# Patient Record
Sex: Female | Born: 1960
Health system: Southern US, Community
[De-identification: ages and names within clinical notes are randomized; demographics above are authoritative.]

---

## 1998-02-01 ENCOUNTER — Other Ambulatory Visit: Admission: RE | Admit: 1998-02-01 | Discharge: 1998-02-01 | Payer: Self-pay | Admitting: Obstetrics and Gynecology

## 1998-02-07 ENCOUNTER — Ambulatory Visit (HOSPITAL_COMMUNITY): Admission: RE | Admit: 1998-02-07 | Discharge: 1998-02-07 | Payer: Self-pay | Admitting: Obstetrics and Gynecology

## 1999-04-17 ENCOUNTER — Other Ambulatory Visit: Admission: RE | Admit: 1999-04-17 | Discharge: 1999-04-17 | Payer: Self-pay | Admitting: Obstetrics & Gynecology

## 2000-04-20 ENCOUNTER — Other Ambulatory Visit: Admission: RE | Admit: 2000-04-20 | Discharge: 2000-04-20 | Payer: Self-pay | Admitting: Obstetrics and Gynecology

## 2001-04-19 ENCOUNTER — Other Ambulatory Visit: Admission: RE | Admit: 2001-04-19 | Discharge: 2001-04-19 | Payer: Self-pay | Admitting: Obstetrics and Gynecology

## 2003-01-19 ENCOUNTER — Ambulatory Visit (HOSPITAL_COMMUNITY): Admission: RE | Admit: 2003-01-19 | Discharge: 2003-01-19 | Payer: Self-pay | Admitting: Obstetrics and Gynecology

## 2003-01-19 ENCOUNTER — Encounter: Payer: Self-pay | Admitting: Obstetrics and Gynecology

## 2003-06-11 ENCOUNTER — Other Ambulatory Visit: Admission: RE | Admit: 2003-06-11 | Discharge: 2003-06-11 | Payer: Self-pay | Admitting: Obstetrics and Gynecology

## 2004-05-30 ENCOUNTER — Ambulatory Visit (HOSPITAL_COMMUNITY): Admission: RE | Admit: 2004-05-30 | Discharge: 2004-05-30 | Payer: Self-pay | Admitting: Obstetrics and Gynecology

## 2004-07-09 ENCOUNTER — Other Ambulatory Visit: Admission: RE | Admit: 2004-07-09 | Discharge: 2004-07-09 | Payer: Self-pay | Admitting: Obstetrics and Gynecology

## 2004-10-27 ENCOUNTER — Ambulatory Visit: Payer: Self-pay | Admitting: Internal Medicine

## 2005-06-05 ENCOUNTER — Ambulatory Visit (HOSPITAL_COMMUNITY): Admission: RE | Admit: 2005-06-05 | Discharge: 2005-06-05 | Payer: Self-pay | Admitting: Obstetrics and Gynecology

## 2005-07-10 ENCOUNTER — Other Ambulatory Visit: Admission: RE | Admit: 2005-07-10 | Discharge: 2005-07-10 | Payer: Self-pay | Admitting: Obstetrics and Gynecology

## 2006-06-07 ENCOUNTER — Ambulatory Visit (HOSPITAL_COMMUNITY): Admission: RE | Admit: 2006-06-07 | Discharge: 2006-06-07 | Payer: Self-pay | Admitting: Obstetrics and Gynecology

## 2006-10-05 ENCOUNTER — Ambulatory Visit: Payer: Self-pay | Admitting: Family Medicine

## 2006-10-11 ENCOUNTER — Ambulatory Visit: Payer: Self-pay | Admitting: Gastroenterology

## 2006-10-21 ENCOUNTER — Ambulatory Visit: Payer: Self-pay | Admitting: Internal Medicine

## 2007-06-13 ENCOUNTER — Ambulatory Visit (HOSPITAL_COMMUNITY): Admission: RE | Admit: 2007-06-13 | Discharge: 2007-06-13 | Payer: Self-pay | Admitting: Obstetrics and Gynecology

## 2007-08-25 ENCOUNTER — Encounter: Payer: Self-pay | Admitting: Internal Medicine

## 2008-06-27 ENCOUNTER — Encounter: Admission: RE | Admit: 2008-06-27 | Discharge: 2008-06-27 | Payer: Self-pay | Admitting: Obstetrics and Gynecology

## 2009-01-15 ENCOUNTER — Encounter: Payer: Self-pay | Admitting: Internal Medicine

## 2009-07-10 ENCOUNTER — Ambulatory Visit: Payer: Self-pay | Admitting: Diagnostic Radiology

## 2009-07-10 ENCOUNTER — Ambulatory Visit (HOSPITAL_BASED_OUTPATIENT_CLINIC_OR_DEPARTMENT_OTHER): Admission: RE | Admit: 2009-07-10 | Discharge: 2009-07-10 | Payer: Self-pay | Admitting: Obstetrics and Gynecology

## 2010-07-15 ENCOUNTER — Ambulatory Visit (HOSPITAL_BASED_OUTPATIENT_CLINIC_OR_DEPARTMENT_OTHER)
Admission: RE | Admit: 2010-07-15 | Discharge: 2010-07-15 | Payer: Self-pay | Source: Home / Self Care | Attending: Obstetrics and Gynecology | Admitting: Obstetrics and Gynecology

## 2011-07-10 ENCOUNTER — Other Ambulatory Visit (HOSPITAL_COMMUNITY): Payer: Self-pay | Admitting: Obstetrics and Gynecology

## 2011-07-10 DIAGNOSIS — Z1231 Encounter for screening mammogram for malignant neoplasm of breast: Secondary | ICD-10-CM

## 2011-07-20 ENCOUNTER — Ambulatory Visit (HOSPITAL_BASED_OUTPATIENT_CLINIC_OR_DEPARTMENT_OTHER)
Admission: RE | Admit: 2011-07-20 | Discharge: 2011-07-20 | Disposition: A | Discharge status: Home / Self Care | Payer: 59 | Source: Ambulatory Visit | Attending: Obstetrics and Gynecology | Admitting: Obstetrics and Gynecology

## 2011-07-20 DIAGNOSIS — Z1231 Encounter for screening mammogram for malignant neoplasm of breast: Secondary | ICD-10-CM

## 2011-08-06 ENCOUNTER — Ambulatory Visit (INDEPENDENT_AMBULATORY_CARE_PROVIDER_SITE_OTHER): Payer: 59 | Admitting: Family

## 2011-08-06 ENCOUNTER — Encounter: Payer: Self-pay | Admitting: Family

## 2011-08-06 DIAGNOSIS — R109 Unspecified abdominal pain: Secondary | ICD-10-CM

## 2011-08-06 DIAGNOSIS — R7309 Other abnormal glucose: Secondary | ICD-10-CM

## 2011-08-06 DIAGNOSIS — R1030 Lower abdominal pain, unspecified: Secondary | ICD-10-CM

## 2011-08-06 DIAGNOSIS — T148XXA Other injury of unspecified body region, initial encounter: Secondary | ICD-10-CM

## 2011-08-06 DIAGNOSIS — R739 Hyperglycemia, unspecified: Secondary | ICD-10-CM

## 2011-08-06 LAB — LIPID PANEL
Cholesterol: 166 mg/dL (ref 0–200)
HDL: 104.2 mg/dL (ref >39.00)
LDL Cholesterol: 54 mg/dL (ref 0–99)
Total CHOL/HDL Ratio: 2
Triglycerides: 37 mg/dL (ref 0.0–149.0)
VLDL: 7.4 mg/dL (ref 0.0–40.0)

## 2011-08-06 LAB — CBC
HCT: 42 % (ref 36.0–46.0)
Hemoglobin: 14.3 g/dL (ref 12.0–15.0)
MCHC: 34 g/dL (ref 30.0–36.0)
MCV: 91.3 fl (ref 78.0–100.0)
Platelets: 244 10*3/uL (ref 150.0–400.0)
RBC: 4.6 Mil/uL (ref 3.87–5.11)
RDW: 13.3 % (ref 11.5–14.6)
WBC: 3.9 10*3/uL — ABNORMAL LOW (ref 4.5–10.5)

## 2011-08-06 LAB — BASIC METABOLIC PANEL
BUN: 8 mg/dL (ref 6–23)
CO2: 30 mEq/L (ref 19–32)
Calcium: 9.7 mg/dL (ref 8.4–10.5)
Chloride: 104 mEq/L (ref 96–112)
Creatinine, Ser: 0.7 mg/dL (ref 0.4–1.2)
GFR: 120.45 mL/min (ref >60.00)
Glucose, Bld: 91 mg/dL (ref 70–99)
Potassium: 4.5 mEq/L (ref 3.5–5.1)
Sodium: 139 mEq/L (ref 135–145)

## 2011-08-06 LAB — TSH: TSH: 0.92 u[IU]/mL (ref 0.35–5.50)

## 2011-08-06 MED ORDER — CYCLOBENZAPRINE HCL 10 MG PO TABS: 10.0000 mg | ORAL_TABLET | Freq: Three times a day (TID) | ORAL | Status: AC | PRN

## 2011-08-06 NOTE — Patient Instructions (Signed)
Muscle Strain A muscle strain, or pulled muscle, occurs when a muscle is over-stretched. A small number of muscle fibers may also be torn. This is especially common in athletes. This happens when a sudden violent force placed on a muscle pushes it past its capacity. Usually, recovery from a pulled muscle takes 1 to 2 weeks. But complete healing will take 5 to 6 weeks. There are millions of muscle fibers. Following injury, your body will usually return to normal quickly. HOME CARE INSTRUCTIONS   While awake, apply ice to the sore muscle for 15 to 20 minutes each hour for the first 2 days. Put ice in a plastic bag and place a towel between the bag of ice and your skin.   Do not use the pulled muscle for several days. Do not use the muscle if you have pain.   You may wrap the injured area with an elastic bandage for comfort. Be careful not to bind it too tightly. This may interfere with blood circulation.   Only take over-the-counter or prescription medicines for pain, discomfort, or fever as directed by your caregiver. Do not use aspirin as this will increase bleeding (bruising) at injury site.   Warming up before exercise helps prevent muscle strains.  SEEK MEDICAL CARE IF:  There is increased pain or swelling in the affected area. MAKE SURE YOU:   Understand these instructions.   Will watch your condition.   Will get help right away if you are not doing well or get worse.  Document Released: 06/29/2005 Document Revised: 03/11/2011 Document Reviewed: 01/26/2007 [employer] Patient Information 2012 [employer]. 

## 2011-08-06 NOTE — Progress Notes (Signed)
  Subjective:    Patient ID: Catherine Conway, female    DOB: [birth year], 51 y.o.   MRN: [id number]  HPI 51 year old [place] American female, nonsmoker,  patient of. Dr. Fabian Sharp presents today with complaints of left hip pain that's been going on for one month. The pain appears to be worse with sitting. She denies any injury but routinely exercises, to once a week lifting more recently than before. Brief the pain about a 6/10, described it as achy and constant. She has not taken any medications for relief.  Patient has a history of hyperglycemia found on employee health laboratory screening. She is here to have it rechecked. She plans to schedule complete physical exam within the next couple weeks. She is fasting today.   Review of Systems  Constitutional: Negative.   Respiratory: Negative.   Cardiovascular: Negative.   Musculoskeletal: Negative.        Left groin pain  Skin: Negative.   Neurological: Negative.   Hematological: Negative.    No past medical history on file.  History   Social History  . Marital Status: Single    Spouse Name: N/A    Number of Children: N/A  . Years of Education: N/A   Occupational History  . Not on file.   Social History Main Topics  . Smoking status: Never Smoker   . Smokeless tobacco: Not on file  . Alcohol Use: Not on file  . Drug Use: Not on file  . Sexually Active: Not on file   Other Topics Concern  . Not on file   Social History Narrative  . No narrative on file    No past surgical history on file.  No family history on file.  No Known Allergies  No current outpatient prescriptions on file prior to visit.    BP 112/82  Pulse 62  Temp(Src) 99.1 F (37.3 C) (Oral)  Resp 14  Ht 5' 1.5" (1.562 m)  Wt 135 lb (61.236 kg)  BMI 25.10 kg/m2  LMP 01/04/2013chart    Objective:   Physical Exam  Constitutional: She is oriented to person, place, and time.  HENT:  Right Ear: External ear normal.  Left Ear: External ear normal.    Nose: Nose normal.  Mouth/Throat: Oropharynx is clear and moist.  Neck: Normal range of motion. Neck supple.  Cardiovascular: Normal rate, regular rhythm and normal heart sounds.   Pulmonary/Chest: Effort normal and breath sounds normal.  Musculoskeletal: Normal range of motion. She exhibits tenderness.       Tenderness to palpation of the left groin.   Neurological: She is alert and oriented to person, place, and time.  Skin: Skin is warm and dry.  Psychiatric: She has a normal mood and affect.          Assessment & Plan:  Assessment: Left groin pain, muscle strain, hyperglycemia  Plan: Flexeril 10 mg 3 times a day. Warned of drowsiness. Ice and heat to the affected area. Lab sent today for complete physical exam to include CBC, CMP, TSH, and lipids. If the patient pending results encouraged healthy diet continue exercise. Call the office if symptoms worsen or persist recheck when necessary.

## 2011-08-06 NOTE — Progress Notes (Signed)
Quick Note:  Left voice message ______ 

## 2011-08-27 ENCOUNTER — Encounter: Payer: 59 | Admitting: Family

## 2011-09-03 ENCOUNTER — Ambulatory Visit (INDEPENDENT_AMBULATORY_CARE_PROVIDER_SITE_OTHER): Payer: 59 | Admitting: Family

## 2011-09-03 ENCOUNTER — Encounter: Payer: Self-pay | Admitting: Family

## 2011-09-03 VITALS — BP 120/80 | Ht 61.5 in | Wt 136.0 lb

## 2011-09-03 DIAGNOSIS — Z0000 Encounter for general adult medical examination without abnormal findings: Secondary | ICD-10-CM

## 2011-09-03 NOTE — Patient Instructions (Signed)

## 2011-09-03 NOTE — Progress Notes (Signed)
  Subjective:    Patient ID: Catherine Conway, female    DOB: 1961-03-01, 51 y.o.   MRN: [id number]  HPI  This is a routine physical examination for this healthy  Female. Reviewed all health maintenance protocols including mammography colonoscopy bone density and reviewed appropriate screening labs. Her immunization history was reviewed as well as her current medications and allergies refills of her chronic medications were given and the plan for yearly health maintenance was discussed all orders and referrals were made as appropriate.   Review of Systems  Constitutional: Negative.   HENT: Negative.   Eyes: Negative.   Respiratory: Negative.   Cardiovascular: Negative.   Gastrointestinal: Negative.   Genitourinary: Negative.   Musculoskeletal: Negative.   Skin: Negative.   Neurological: Negative.   Hematological: Negative.   Psychiatric/Behavioral: Negative.       No past medical history on file.  History   Social History  . Marital Status: Single    Spouse Name: N/A    Number of Children: N/A  . Years of Education: N/A   Occupational History  . Not on file.   Social History Main Topics  . Smoking status: Never Smoker   . Smokeless tobacco: Not on file  . Alcohol Use: Not on file  . Drug Use: Not on file  . Sexually Active: Not on file   Other Topics Concern  . Not on file   Social History Narrative  . No narrative on file    No past surgical history on file.  No family history on file.  No Known Allergies  Current Outpatient Prescriptions on File Prior to Visit  Medication Sig Dispense Refill  . NON FORMULARY Take by mouth daily. NutraLite Multivitamins.        BP 120/80  Ht 5' 1.5" (1.562 m)  Wt 136 lb (61.689 kg)  BMI 25.28 kg/m2chart  Objective:   Physical Exam  Constitutional: She is oriented to person, place, and time. She appears well-developed and well-nourished.  HENT:  Head: Normocephalic and atraumatic.  Right Ear: External ear normal.    Left Ear: External ear normal.  Nose: Nose normal.  Mouth/Throat: Oropharynx is clear and moist.  Eyes: Conjunctivae and EOM are normal. Pupils are equal, round, and reactive to light.  Neck: Normal range of motion. Neck supple.  Cardiovascular: Normal rate, regular rhythm, normal heart sounds and intact distal pulses.   Pulmonary/Chest: Effort normal and breath sounds normal.  Abdominal: Soft. Bowel sounds are normal.  Genitourinary:       Deferred to GYN  Musculoskeletal: Normal range of motion.  Neurological: She is alert and oriented to person, place, and time. She has normal reflexes.  Skin: Skin is warm and dry.  Psychiatric: She has a normal mood and affect.   Breast exam also deferred to GYN       Assessment & Plan:  Assessment: Complete physical exam  Plan: Continue healthy diet and exercise and she has been doing. Self breast exams on a monthly basis. Calcium and vitamin D daily. Patient to follow with her gynecologist for her Pap smear next month. Recheck here as needed in one year.

## 2011-10-07 ENCOUNTER — Ambulatory Visit (INDEPENDENT_AMBULATORY_CARE_PROVIDER_SITE_OTHER): Payer: 59 | Admitting: Obstetrics and Gynecology

## 2011-10-07 DIAGNOSIS — Z01419 Encounter for gynecological examination (general) (routine) without abnormal findings: Secondary | ICD-10-CM

## 2011-11-11 ENCOUNTER — Ambulatory Visit (INDEPENDENT_AMBULATORY_CARE_PROVIDER_SITE_OTHER): Payer: 59

## 2011-11-11 DIAGNOSIS — Z23 Encounter for immunization: Secondary | ICD-10-CM

## 2012-01-18 ENCOUNTER — Encounter: Payer: Self-pay | Admitting: Family

## 2012-01-18 ENCOUNTER — Ambulatory Visit (INDEPENDENT_AMBULATORY_CARE_PROVIDER_SITE_OTHER): Payer: 59 | Admitting: Family

## 2012-01-18 ENCOUNTER — Ambulatory Visit (INDEPENDENT_AMBULATORY_CARE_PROVIDER_SITE_OTHER)
Admission: RE | Admit: 2012-01-18 | Discharge: 2012-01-18 | Disposition: A | Discharge status: Home / Self Care | Payer: 59 | Source: Ambulatory Visit | Attending: Family | Admitting: Family

## 2012-01-18 VITALS — BP 118/78 | HR 87 | Temp 98.3°F

## 2012-01-18 DIAGNOSIS — M7070 Other bursitis of hip, unspecified hip: Secondary | ICD-10-CM

## 2012-01-18 DIAGNOSIS — M25559 Pain in unspecified hip: Secondary | ICD-10-CM

## 2012-01-18 DIAGNOSIS — M25551 Pain in right hip: Secondary | ICD-10-CM

## 2012-01-18 DIAGNOSIS — M76899 Other specified enthesopathies of unspecified lower limb, excluding foot: Secondary | ICD-10-CM

## 2012-01-18 NOTE — Patient Instructions (Addendum)
Bursitis  Bursitis is a swelling and soreness (inflammation) of a fluid-filled sac (bursa) that overlies and protects a joint. It can be caused by injury, overuse of the joint, arthritis or infection. The joints most likely to be affected are the elbows, shoulders, hips and knees.  HOME CARE INSTRUCTIONS    Apply ice to the affected area for 15 to 20 minutes each hour while awake for 2 days. Put the ice in a plastic bag and place a towel between the bag of ice and your skin.   Rest the injured joint as much as possible, but continue to put the joint through a full range of motion, 4 times per day. (The shoulder joint especially becomes rapidly "frozen" if not used.) When the pain lessens, begin normal slow movements and usual activities.   Only take over-the-counter or prescription medicines for pain, discomfort or fever as directed by your caregiver.   Your caregiver may recommend draining the bursa and injecting medicine into the bursa. This may help the healing process.   Follow all instructions for follow-up with your caregiver. This includes any orthopedic referrals, physical therapy and rehabilitation. Any delay in obtaining necessary care could result in a delay or failure of the bursitis to heal and chronic pain.  SEEK IMMEDIATE MEDICAL CARE IF:    Your pain increases even during treatment.   You develop an oral temperature above 102 F (38.9 C) and have heat and inflammation over the involved bursa.  MAKE SURE YOU:    Understand these instructions.   Will watch your condition.   Will get help right away if you are not doing well or get worse.  Document Released: 06/26/2000 Document Revised: 06/18/2011 Document Reviewed: 05/31/2009  [employer] Patient Information 2012 [employer].

## 2012-01-18 NOTE — Progress Notes (Signed)
  Subjective:    Patient ID: Catherine Conway, female    DOB: 04-26-1961, 51 y.o.   MRN: [id number]  HPI 51 year old American female, nonsmoker is in today with complaints of right hip pain. Her pain has been ongoing x1 week. His improve significantly from last week. She rated it at that time a 9/10, today at 3/10. She has been applying ice and rest. She works out on a daily basis and has recently increased her cardio 15 minutes. Denies any numbness or tingling   Review of Systems  Constitutional: Negative.   Respiratory: Negative.   Cardiovascular: Negative.   Gastrointestinal: Negative.   Genitourinary: Negative.   Musculoskeletal: Positive for arthralgias.       Right hip pain  Skin: Negative.   Neurological: Negative.   Hematological: Negative.   Psychiatric/Behavioral: Negative.    No past medical history on file.  History   Social History  . Marital Status: Single    Spouse Name: N/A    Number of Children: N/A  . Years of Education: N/A   Occupational History  . Not on file.   Social History Main Topics  . Smoking status: Never Smoker   . Smokeless tobacco: Not on file  . Alcohol Use: Not on file  . Drug Use: Not on file  . Sexually Active: Not on file   Other Topics Concern  . Not on file   Social History Narrative  . No narrative on file    No past surgical history on file.  No family history on file.  No Known Allergies  Current Outpatient Prescriptions on File Prior to Visit  Medication Sig Dispense Refill  . cyclobenzaprine (FLEXERIL) 10 MG tablet       . NON FORMULARY Take by mouth daily. NutraLite Multivitamins.        BP 118/78  Pulse 87  Temp 98.3 F (36.8 C) (Oral)  SpO2 98%  LMP 06/10/2013chart    Objective:   Physical Exam  Constitutional: She is oriented to person, place, and time. She appears well-developed and well-nourished.  Neck: Normal range of motion. Neck supple.  Cardiovascular: Normal rate, regular rhythm and normal heart  sounds.   Pulmonary/Chest: Effort normal and breath sounds normal.  Abdominal: Soft.  Musculoskeletal: Normal range of motion. She exhibits no edema.       Right hip pain to palpation. Full ROM with mild pain only with external rotation.  Neurological: She is alert and oriented to person, place, and time. She has normal reflexes.  Skin: Skin is warm and dry.  Psychiatric: She has a normal mood and affect.          Assessment & Plan:  Assessment: Right hip bursitis, right hip pain  Plan: Aleve over-the-counter twice daily. Continue ice. X-ray of the right hip since this problem has occurred before. We'll follow the patient in the results of her x-ray, schedule, when necessary.

## 2012-06-27 ENCOUNTER — Other Ambulatory Visit: Payer: Self-pay | Admitting: Obstetrics and Gynecology

## 2012-06-27 DIAGNOSIS — Z1231 Encounter for screening mammogram for malignant neoplasm of breast: Secondary | ICD-10-CM

## 2012-07-13 HISTORY — PX: COLONOSCOPY: SHX5424

## 2012-07-20 ENCOUNTER — Inpatient Hospital Stay (HOSPITAL_BASED_OUTPATIENT_CLINIC_OR_DEPARTMENT_OTHER): Admission: RE | Admit: 2012-07-20 | Payer: 59 | Source: Ambulatory Visit

## 2012-07-21 ENCOUNTER — Inpatient Hospital Stay (HOSPITAL_BASED_OUTPATIENT_CLINIC_OR_DEPARTMENT_OTHER): Admission: RE | Admit: 2012-07-21 | Payer: 59 | Source: Ambulatory Visit

## 2012-07-27 ENCOUNTER — Ambulatory Visit (HOSPITAL_BASED_OUTPATIENT_CLINIC_OR_DEPARTMENT_OTHER)
Admission: RE | Admit: 2012-07-27 | Discharge: 2012-07-27 | Disposition: A | Discharge status: Home / Self Care | Payer: 59 | Source: Ambulatory Visit | Attending: Obstetrics and Gynecology | Admitting: Obstetrics and Gynecology

## 2012-07-27 DIAGNOSIS — Z1231 Encounter for screening mammogram for malignant neoplasm of breast: Secondary | ICD-10-CM | POA: Insufficient documentation

## 2012-09-21 ENCOUNTER — Ambulatory Visit: Payer: 59 | Admitting: Obstetrics and Gynecology

## 2013-01-11 ENCOUNTER — Other Ambulatory Visit (INDEPENDENT_AMBULATORY_CARE_PROVIDER_SITE_OTHER): Payer: 59

## 2013-01-11 DIAGNOSIS — Z0000 Encounter for general adult medical examination without abnormal findings: Secondary | ICD-10-CM

## 2013-01-11 LAB — POCT URINALYSIS DIPSTICK
Bilirubin, UA: NEGATIVE
Blood, UA: NEGATIVE
Glucose, UA: NEGATIVE
Ketones, UA: NEGATIVE
Leukocytes, UA: NEGATIVE
Nitrite, UA: NEGATIVE
Protein, UA: NEGATIVE
Spec Grav, UA: 1.01
Urobilinogen, UA: 0.2
pH, UA: 7.5

## 2013-01-11 LAB — CBC WITH DIFFERENTIAL/PLATELET
Basophils Absolute: 0 10*3/uL (ref 0.0–0.1)
Basophils Relative: 0.7 % (ref 0.0–3.0)
Eosinophils Absolute: 0.1 10*3/uL (ref 0.0–0.7)
Eosinophils Relative: 2.4 % (ref 0.0–5.0)
HCT: 36 % (ref 36.0–46.0)
Hemoglobin: 12 g/dL (ref 12.0–15.0)
Lymphocytes Relative: 53.6 % — ABNORMAL HIGH (ref 12.0–46.0)
Lymphs Abs: 2 10*3/uL (ref 0.7–4.0)
MCHC: 33.3 g/dL (ref 30.0–36.0)
MCV: 89.9 fl (ref 78.0–100.0)
Monocytes Absolute: 0.3 10*3/uL (ref 0.1–1.0)
Monocytes Relative: 8.9 % (ref 3.0–12.0)
Neutro Abs: 1.3 10*3/uL — ABNORMAL LOW (ref 1.4–7.7)
Neutrophils Relative %: 34.4 % — ABNORMAL LOW (ref 43.0–77.0)
Platelets: 278 10*3/uL (ref 150.0–400.0)
RBC: 4 Mil/uL (ref 3.87–5.11)
RDW: 13.4 % (ref 11.5–14.6)
WBC: 3.8 10*3/uL — ABNORMAL LOW (ref 4.5–10.5)

## 2013-01-11 LAB — LDL CHOLESTEROL, DIRECT: Direct LDL: 58.3 mg/dL

## 2013-01-11 LAB — BASIC METABOLIC PANEL
BUN: 11 mg/dL (ref 6–23)
CO2: 25 mEq/L (ref 19–32)
Calcium: 9.6 mg/dL (ref 8.4–10.5)
Chloride: 105 mEq/L (ref 96–112)
Creatinine, Ser: 0.7 mg/dL (ref 0.4–1.2)
GFR: 111.99 mL/min (ref >60.00)
Glucose, Bld: 101 mg/dL — ABNORMAL HIGH (ref 70–99)
Potassium: 5 mEq/L (ref 3.5–5.1)
Sodium: 139 mEq/L (ref 135–145)

## 2013-01-11 LAB — LIPID PANEL
Cholesterol: 204 mg/dL — ABNORMAL HIGH (ref 0–200)
HDL: 118.2 mg/dL (ref >39.00)
Total CHOL/HDL Ratio: 2
Triglycerides: 36 mg/dL (ref 0.0–149.0)
VLDL: 7.2 mg/dL (ref 0.0–40.0)

## 2013-01-11 LAB — HEPATIC FUNCTION PANEL
ALT: 16 U/L (ref 0–35)
AST: 20 U/L (ref 0–37)
Albumin: 3.9 g/dL (ref 3.5–5.2)
Alkaline Phosphatase: 43 U/L (ref 39–117)
Bilirubin, Direct: 0 mg/dL (ref 0.0–0.3)
Total Bilirubin: 0.6 mg/dL (ref 0.3–1.2)
Total Protein: 7.2 g/dL (ref 6.0–8.3)

## 2013-01-11 LAB — TSH: TSH: 0.9 u[IU]/mL (ref 0.35–5.50)

## 2013-01-17 ENCOUNTER — Encounter: Payer: Self-pay | Admitting: Family

## 2013-01-17 ENCOUNTER — Other Ambulatory Visit: Payer: Self-pay

## 2013-01-17 ENCOUNTER — Encounter: Payer: Self-pay | Admitting: Internal Medicine

## 2013-01-17 ENCOUNTER — Ambulatory Visit (INDEPENDENT_AMBULATORY_CARE_PROVIDER_SITE_OTHER): Payer: 59 | Admitting: Family

## 2013-01-17 VITALS — BP 114/80 | HR 78 | Ht 61.75 in | Wt 145.0 lb

## 2013-01-17 DIAGNOSIS — Z0000 Encounter for general adult medical examination without abnormal findings: Secondary | ICD-10-CM

## 2013-01-17 NOTE — Progress Notes (Signed)
  Subjective:    Patient ID: Catherine Conway, female    DOB: Jan 07, 1961, 52 y.o.   MRN: [id number]  HPI  This is a routine physical examination for this healthy African American Female. Pt reports recent menstrual irregularities that is being managed by GYN. Reviewed all health maintenance protocols including mammography colonoscopy bone density and reviewed appropriate screening labs. Her immunization history was reviewed as well as her current medications and allergies and the plan for yearly health maintenance was discussed all orders and referrals were made as appropriate.   Review of Systems  Constitutional: Negative.   HENT: Negative.   Eyes: Negative.   Respiratory: Negative.   Cardiovascular: Negative.   Gastrointestinal: Negative.   Endocrine: Negative.   Genitourinary: Positive for menstrual problem.  Musculoskeletal: Negative.  Negative for joint swelling.  Skin: Negative.   Allergic/Immunologic: Negative.   Neurological: Negative.   Hematological: Negative.   Psychiatric/Behavioral: Negative.    No past medical history on file.  History   Social History  . Marital Status: Single    Spouse Name: N/A    Number of Children: N/A  . Years of Education: N/A   Occupational History  . Not on file.   Social History Main Topics  . Smoking status: Never Smoker   . Smokeless tobacco: Not on file  . Alcohol Use: Not on file  . Drug Use: Not on file  . Sexually Active: Not on file   Other Topics Concern  . Not on file   Social History Narrative  . No narrative on file    No past surgical history on file.  No family history on file.  No Known Allergies  Current Outpatient Prescriptions on File Prior to Visit  Medication Sig Dispense Refill  . NON FORMULARY Take by mouth daily. NutraLite Multivitamins.      . cyclobenzaprine (FLEXERIL) 10 MG tablet        No current facility-administered medications on file prior to visit.    BP 114/80  Pulse 78  Ht 5' 1.75"  (1.568 m)  Wt 145 lb (65.772 kg)  BMI 26.75 kg/m2  SpO2 97%chart    Objective:   Physical Exam  Constitutional: She is oriented to person, place, and time. She appears well-developed and well-nourished.  HENT:  Head: Normocephalic and atraumatic.  Eyes: Conjunctivae are normal. Pupils are equal, round, and reactive to light.  Neck: Normal range of motion.  Cardiovascular: Normal rate, regular rhythm and normal heart sounds.   Pulmonary/Chest: Effort normal and breath sounds normal.  Abdominal: Soft.  Musculoskeletal: Normal range of motion.  Neurological: She is alert and oriented to person, place, and time.  Skin: Skin is warm and dry.          Assessment & Plan:  1. Physical Referral made to GI for colonoscopy screening. Pt reports no acute issues at this time. Instructed to follow up with PCP if any acute issues arise or with any questions or concerns.  Note by: S.Keah, FNP Student

## 2013-01-17 NOTE — Progress Notes (Signed)
Error

## 2013-01-31 ENCOUNTER — Ambulatory Visit (INDEPENDENT_AMBULATORY_CARE_PROVIDER_SITE_OTHER): Payer: 59 | Admitting: Family

## 2013-01-31 ENCOUNTER — Encounter: Payer: Self-pay | Admitting: Family

## 2013-01-31 ENCOUNTER — Ambulatory Visit (INDEPENDENT_AMBULATORY_CARE_PROVIDER_SITE_OTHER)
Admission: RE | Admit: 2013-01-31 | Discharge: 2013-01-31 | Disposition: A | Discharge status: Home / Self Care | Payer: 59 | Source: Ambulatory Visit | Attending: Family | Admitting: Family

## 2013-01-31 VITALS — BP 100/60 | HR 66

## 2013-01-31 DIAGNOSIS — M79641 Pain in right hand: Secondary | ICD-10-CM

## 2013-01-31 DIAGNOSIS — M79609 Pain in unspecified limb: Secondary | ICD-10-CM

## 2013-01-31 NOTE — Progress Notes (Signed)
  Subjective:    Patient ID: Catherine Conway, female    DOB: [birth year], 52 y.o.   MRN: [id number]  HPI  Pt is a 52 year old [place] American female who presents to PCP with R hand pain x 2 week. Reports a mechanical fall in which she caught her self on her R hand. Pain is localized at the base of the R middle finger. Rates pain 5/10 and reports decreased ROM to the R middle finger. States pain is worsened with movement. Reports managing pain with OTC medications.   Review of Systems  Constitutional: Negative.   HENT: Negative.   Eyes: Negative.   Respiratory: Negative.   Cardiovascular: Negative.   Gastrointestinal: Negative.   Endocrine: Negative.   Genitourinary: Negative.   Musculoskeletal:       R hand pain  Skin: Negative.   Allergic/Immunologic: Negative.   Neurological: Negative.   Hematological: Negative.   Psychiatric/Behavioral: Negative.    No past medical history on file.  History   Social History  . Marital Status: Single    Spouse Name: N/A    Number of Children: N/A  . Years of Education: N/A   Occupational History  . Not on file.   Social History Main Topics  . Smoking status: Never Smoker   . Smokeless tobacco: Not on file  . Alcohol Use: Not on file  . Drug Use: Not on file  . Sexually Active: Not on file   Other Topics Concern  . Not on file   Social History Narrative  . No narrative on file    No past surgical history on file.  No family history on file.  No Known Allergies  Current Outpatient Prescriptions on File Prior to Visit  Medication Sig Dispense Refill  . NON FORMULARY Take by mouth daily. NutraLite Multivitamins.      . cyclobenzaprine (FLEXERIL) 10 MG tablet        No current facility-administered medications on file prior to visit.    BP 100/60  Pulse 66  SpO2 98%chart     Objective:   Physical Exam  Constitutional: She appears well-developed and well-nourished.  HENT:  Head: Normocephalic and atraumatic.  Eyes:  Conjunctivae are normal. Pupils are equal, round, and reactive to light.  Neck: Normal range of motion.  Cardiovascular: Normal rate and regular rhythm.   Pulmonary/Chest: Effort normal and breath sounds normal.  Abdominal: Soft. Bowel sounds are normal.  Musculoskeletal:       Right hand: She exhibits decreased range of motion and tenderness.  Decreased ROM, mild edema and ecchymosis and pain with palpation to base of R middle finger          Assessment & Plan:  1. R hand pain  Pt sent for x-ray of R hand to evaluate for possible fx. Pt instructed to continue OTC pain meds as necessary. PCP will follow up with x-ray results. Pt instructed to contact PCP with any questions or concerns.   Note by Davonna Belling, FNP Student

## 2013-02-08 ENCOUNTER — Ambulatory Visit: Payer: 59 | Admitting: Family

## 2013-02-08 ENCOUNTER — Encounter: Payer: Self-pay | Admitting: Family

## 2013-02-08 VITALS — BP 122/68 | HR 62 | Wt 145.0 lb

## 2013-03-31 ENCOUNTER — Ambulatory Visit (AMBULATORY_SURGERY_CENTER): Payer: Self-pay | Admitting: *Deleted

## 2013-03-31 VITALS — Ht 62.0 in | Wt 142.0 lb

## 2013-03-31 DIAGNOSIS — Z1211 Encounter for screening for malignant neoplasm of colon: Secondary | ICD-10-CM

## 2013-03-31 MED ORDER — NA SULFATE-K SULFATE-MG SULF 17.5-3.13-1.6 GM/177ML PO SOLN: ORAL | Status: DC

## 2013-03-31 NOTE — Progress Notes (Signed)
No egg or soy problems

## 2013-04-04 ENCOUNTER — Encounter: Payer: Self-pay | Admitting: Internal Medicine

## 2013-04-14 ENCOUNTER — Ambulatory Visit (AMBULATORY_SURGERY_CENTER): Payer: 59 | Admitting: Internal Medicine

## 2013-04-14 ENCOUNTER — Encounter: Payer: Self-pay | Admitting: Internal Medicine

## 2013-04-14 VITALS — BP 112/77 | HR 58 | Temp 98.8°F | Resp 37 | Ht 62.0 in | Wt 142.0 lb

## 2013-04-14 DIAGNOSIS — Z1211 Encounter for screening for malignant neoplasm of colon: Secondary | ICD-10-CM

## 2013-04-14 MED ORDER — SODIUM CHLORIDE 0.9 % IV SOLN: 500.0000 mL | INTRAVENOUS | Status: DC

## 2013-04-14 NOTE — Progress Notes (Signed)
Report to pacu rn, vss, bbs=clear 

## 2013-04-14 NOTE — Patient Instructions (Addendum)
The colonoscopy was normal and the prep was excellent. Next routine colonoscopy in 10 years (2024).  I appreciate the opportunity to care for you. Iva Boop, MD, FACG  YOU HAD AN ENDOSCOPIC PROCEDURE TODAY AT THE [facility]: Refer to the procedure report that was given to you for any specific questions about what was found during the examination.  If the procedure report does not answer your questions, please call your gastroenterologist to clarify.  If you requested that your care partner not be given the details of your procedure findings, then the procedure report has been included in a sealed envelope for you to review at your convenience later.  YOU SHOULD EXPECT: Some feelings of bloating in the abdomen. Passage of more gas than usual.  Walking can help get rid of the air that was put into your GI tract during the procedure and reduce the bloating. If you had a lower endoscopy (such as a colonoscopy or flexible sigmoidoscopy) you may notice spotting of blood in your stool or on the toilet paper. If you underwent a bowel prep for your procedure, then you may not have a normal bowel movement for a few days.  DIET: Your first meal following the procedure should be a light meal and then it is ok to progress to your normal diet.  A half-sandwich or bowl of soup is an example of a good first meal.  Heavy or fried foods are harder to digest and may make you feel nauseous or bloated.  Likewise meals heavy in dairy and vegetables can cause extra gas to form and this can also increase the bloating.  Drink plenty of fluids but you should avoid alcoholic beverages for 24 hours.  ACTIVITY: Your care partner should take you home directly after the procedure.  You should plan to take it easy, moving slowly for the rest of the day.  You can resume normal activity the day after the procedure however you should NOT DRIVE or use heavy machinery for 24 hours (because of the sedation medicines used  during the test).    SYMPTOMS TO REPORT IMMEDIATELY: A gastroenterologist can be reached at any hour.  During normal business hours, 8:30 AM to 5:00 PM Monday through Friday, call [phone].  After hours and on weekends, please call the GI answering service at [phone] who will take a message and have the physician on call contact you.   Following lower endoscopy (colonoscopy or flexible sigmoidoscopy):  Excessive amounts of blood in the stool  Significant tenderness or worsening of abdominal pains  Swelling of the abdomen that is new, acute  Fever of 100F or higher  FOLLOW UP: If any biopsies were taken you will be contacted by phone or by letter within the next 1-3 weeks.  Call your gastroenterologist if you have not heard about the biopsies in 3 weeks.  Our staff will call the home number listed on your records the next business day following your procedure to check on you and address any questions or concerns that you may have at that time regarding the information given to you following your procedure. This is a courtesy call and so if there is no answer at the home number and we have not heard from you through the emergency physician on call, we will assume that you have returned to your regular daily activities without incident.  SIGNATURES/CONFIDENTIALITY: You and/or your care partner have signed paperwork which will be entered into your electronic medical record.  These signatures attest to the fact that that the information above on your After Visit Summary has been reviewed and is understood.  Full responsibility of the confidentiality of this discharge information lies with you and/or your care-partner.

## 2013-04-14 NOTE — Progress Notes (Signed)
Patient did not have preoperative order for IV antibiotic SSI prophylaxis. (G8918)  Patient did not experience any of the following events: a burn prior to discharge; a fall within the facility; wrong site/side/patient/procedure/implant event; or a hospital transfer or hospital admission upon discharge from the facility. (G8907)  

## 2013-04-14 NOTE — Op Note (Signed)
[facility] [address] [place], [address]   COLONOSCOPY PROCEDURE REPORT  PATIENT: Catherine Conway, Catherine Conway  MR#: [id number] BIRTHDATE: January 20, 1961 , 52  yrs. old GENDER: Female ENDOSCOPIST: Iva Boop, MD, [facility] REFERRED AO:[id number] Orvan Falconer, FNP-BC PROCEDURE DATE:  04/14/2013 PROCEDURE:   Colonoscopy, screening First Screening Colonoscopy - Avg.  risk and is 50 yrs.  old or older Yes.  Prior Negative Screening - Now for repeat screening. N/A  History of Adenoma - Now for follow-up colonoscopy & has been > or = to 3 yrs.  N/A  Polyps Removed Today? No.  Recommend repeat exam, <10 yrs? No. ASA CLASS:   Class I INDICATIONS:average risk screening and first colonoscopy. MEDICATIONS: propofol (Diprivan) 250mg  IV, MAC sedation, administered by CRNA, and These medications were titrated to patient response per physician's verbal order  DESCRIPTION OF PROCEDURE:   After the risks benefits and alternatives of the procedure were thoroughly explained, informed consent was obtained.  A digital rectal exam revealed no abnormalities of the rectum.   The LB [id number]-[id number]  endoscope was introduced through the anus and advanced to the cecum, which was identified by both the appendix and ileocecal valve. No adverse events experienced.   The quality of the prep was excellent using Suprep  The instrument was then slowly withdrawn as the colon was fully examined.      COLON FINDINGS: A normal appearing cecum, ileocecal valve, and appendiceal orifice were identified.  The ascending, hepatic flexure, transverse, splenic flexure, descending, sigmoid colon and rectum appeared unremarkable.  No polyps or cancers were seen.   A right colon retroflexion was performed.  Retroflexed views revealed no abnormalities. The time to cecum=2 minutes 40 seconds. Withdrawal time=7 minutes 06 seconds.  The scope was withdrawn and the procedure completed. COMPLICATIONS: There were no  complications.  ENDOSCOPIC IMPRESSION: Normal colonoscopy - excellent prep.  RECOMMENDATIONS: Repeat routine colonoscopy 10 years - 2024 - annual hemoccults not necessary in interval. Elderly mother (72) had colon cancer but given age of mom patient is average risk at this point.   eSigned:  Iva Boop, MD, [facility] 04/14/2013 10:53 AM   cc: Jerilynn Som and The Patient

## 2013-04-17 ENCOUNTER — Telehealth: Payer: Self-pay | Admitting: *Deleted

## 2013-04-17 NOTE — Telephone Encounter (Signed)
  Follow up Call-  Call back number 04/14/2013  Post procedure Call Back phone  # [phone]  Permission to leave phone message Yes     Patient questions:  Do you have a fever, pain , or abdominal swelling? no Pain Score  0 *  Have you tolerated food without any problems? yes  Have you been able to return to your normal activities? yes  Do you have any questions about your discharge instructions: Diet   no Medications  no Follow up visit  no  Do you have questions or concerns about your Care? no  Actions: * If pain score is 4 or above: No action needed, pain <4.

## 2013-07-28 ENCOUNTER — Other Ambulatory Visit: Payer: Self-pay | Admitting: Obstetrics and Gynecology

## 2013-07-28 DIAGNOSIS — Z1231 Encounter for screening mammogram for malignant neoplasm of breast: Secondary | ICD-10-CM

## 2013-08-07 ENCOUNTER — Ambulatory Visit (HOSPITAL_BASED_OUTPATIENT_CLINIC_OR_DEPARTMENT_OTHER)
Admission: RE | Admit: 2013-08-07 | Discharge: 2013-08-07 | Disposition: A | Discharge status: Home / Self Care | Payer: 59 | Source: Ambulatory Visit | Attending: Obstetrics and Gynecology | Admitting: Obstetrics and Gynecology

## 2013-08-07 DIAGNOSIS — Z1231 Encounter for screening mammogram for malignant neoplasm of breast: Secondary | ICD-10-CM

## 2013-09-24 ENCOUNTER — Encounter (HOSPITAL_BASED_OUTPATIENT_CLINIC_OR_DEPARTMENT_OTHER): Payer: Self-pay | Admitting: Emergency Medicine

## 2013-09-24 ENCOUNTER — Emergency Department (HOSPITAL_BASED_OUTPATIENT_CLINIC_OR_DEPARTMENT_OTHER)
Admission: EM | Admit: 2013-09-24 | Discharge: 2013-09-24 | Disposition: A | Discharge status: Home / Self Care | Payer: 59 | Attending: Emergency Medicine | Admitting: Emergency Medicine

## 2013-09-24 DIAGNOSIS — S91119A Laceration without foreign body of unspecified toe without damage to nail, initial encounter: Secondary | ICD-10-CM

## 2013-09-24 DIAGNOSIS — Y9389 Activity, other specified: Secondary | ICD-10-CM | POA: Insufficient documentation

## 2013-09-24 DIAGNOSIS — X58XXXA Exposure to other specified factors, initial encounter: Secondary | ICD-10-CM | POA: Insufficient documentation

## 2013-09-24 DIAGNOSIS — S91109A Unspecified open wound of unspecified toe(s) without damage to nail, initial encounter: Secondary | ICD-10-CM | POA: Insufficient documentation

## 2013-09-24 DIAGNOSIS — Z79899 Other long term (current) drug therapy: Secondary | ICD-10-CM | POA: Insufficient documentation

## 2013-09-24 DIAGNOSIS — Y929 Unspecified place or not applicable: Secondary | ICD-10-CM | POA: Insufficient documentation

## 2013-09-24 NOTE — ED Notes (Signed)
Hit right big toe in closet, states has laceration to top of toe- bandage not removed in triage

## 2013-09-24 NOTE — Discharge Instructions (Signed)

## 2013-09-24 NOTE — ED Provider Notes (Signed)
CSN: [id number]     Arrival date & time 09/24/13  1815 History   First MD Initiated Contact with Patient 09/24/13 1826     This chart was scribed for Dagmar Hait, MD by Arlan Organ, ED Scribe. This patient was seen in room MH07/MH07 and the patient's care was started 6:33 PM.   Chief Complaint  Patient presents with  . Toe Injury   Patient is a 53 y.o. female presenting with toe pain. The history is provided by the patient. No language interpreter was used.  Toe Pain This is a new problem. The current episode started 3 to 5 hours ago. The problem occurs constantly. The problem has not changed since onset.Pertinent negatives include no chest pain, no abdominal pain, no headaches and no shortness of breath. Nothing aggravates the symptoms. Nothing relieves the symptoms. She has tried nothing for the symptoms. The treatment provided no relief.    HPI Comments: Meghana Tullo is a 53 y.o. female who presents to the Emergency Department complaining of right great toe pain that started just prior to arrival. She describes the pain as "thobbing". Pt states she scraped her toe while attempting to open the closet door. She has now noted a laceration to the toe. Tetanus UTD. Pt has no other pertinent medical history. No other concerns this visit.   History reviewed. No pertinent past medical history. History reviewed. No pertinent past surgical history. Family History  Problem Relation Age of Onset  . Colon cancer Mother 83  . Esophageal cancer Neg Hx   . Stomach cancer Neg Hx   . Rectal cancer Neg Hx    History  Substance Use Topics  . Smoking status: Never Smoker   . Smokeless tobacco: Never Used  . Alcohol Use: No   OB History   Grav Para Term Preterm Abortions TAB SAB Ect Mult Living                 Review of Systems  Constitutional: Negative for fever and chills.  HENT: Negative for congestion.   Eyes: Negative for redness.  Respiratory: Negative for cough and shortness  of breath.   Cardiovascular: Negative for chest pain.  Gastrointestinal: Negative for abdominal pain.  Skin: Positive for wound.  Neurological: Negative for headaches.  Psychiatric/Behavioral: Negative for confusion.  All other systems reviewed and are negative.      Allergies  Review of patient's allergies indicates no known allergies.  Home Medications   Current Outpatient Rx  Name  Route  Sig  Dispense  Refill  . Ascorbic Acid (VITAMIN C PO)   Oral   Take by mouth daily.         . Cholecalciferol (VITAMIN D PO)   Oral   Take by mouth daily.         . Glucosamine HCl (GLUCOSAMINE PO)   Oral   Take by mouth daily.         . NON FORMULARY   Oral   Take by mouth daily. NutraLite Multivitamins.         . NON FORMULARY      Fruit and vegetables 2 tablets daily         . NON FORMULARY      Osha essential health 1 tablet daily          Triage Vitals: BP 141/74  Pulse 71  Temp(Src) 99.6 F (37.6 C) (Oral)  Resp 20  Ht 5\' 2"  (1.575 m)  Wt 142 lb (64.411 kg)  BMI 25.97 kg/m2  SpO2 100%   Physical Exam  Nursing note and vitals reviewed. Constitutional: She is oriented to person, place, and time. She appears well-developed and well-nourished. No distress.  HENT:  Head: Normocephalic and atraumatic.  Eyes: EOM are normal.  Neck: Normal range of motion.  Cardiovascular: Normal rate, regular rhythm and normal heart sounds.   Pulmonary/Chest: Effort normal and breath sounds normal. No respiratory distress.  Abdominal: Soft. She exhibits no distension. There is no tenderness.  Musculoskeletal: Normal range of motion.  Neurological: She is alert and oriented to person, place, and time.  Skin: Skin is warm and dry.  2 cm laceration across the top of the right great toe Good blood flow  Psychiatric: She has a normal mood and affect. Judgment normal.    ED Course  NERVE BLOCK Date/Time: 09/25/2013 12:12 AM Performed by: Dagmar HaitWALDEN, WILLIAM  Moises Terpstra Authorized by: Dagmar HaitWALDEN, WILLIAM Mahki Spikes Consent: Verbal consent obtained. Indications: pain relief and debridement Body area: lower extremity Nerve: digital Laterality: right (Great toe) Patient sedated: no Preparation: Patient was prepped and draped in the usual sterile fashion. Patient position: sitting Needle gauge: 25 G Location technique: anatomical landmarks Local anesthetic: lidocaine 1% without epinephrine Anesthetic total: 10 ml Outcome: pain improved Patient tolerance: Patient tolerated the procedure well with no immediate complications.  LACERATION REPAIR Date/Time: 09/25/2013 12:13 AM Performed by: Dagmar HaitWALDEN, WILLIAM Alwyn Cordner Authorized by: Dagmar HaitWALDEN, WILLIAM Anyely Cunning Consent: Verbal consent obtained. Body area: lower extremity Location details: right big toe Laceration length: 2 cm Foreign bodies: no foreign bodies Tendon involvement: none Nerve involvement: none Vascular damage: no Anesthesia: local infiltration and digital block Local anesthetic: lidocaine 1% with epinephrine Anesthetic total: 2 (2 cc of local plus digital block) ml Patient sedated: no Preparation: Patient was prepped and draped in the usual sterile fashion. Irrigation solution: saline Irrigation method: jet lavage Amount of cleaning: standard Debridement: none Degree of undermining: none Subcutaneous closure: 4-0 Vicryl Number of sutures: 7 Technique: simple Approximation: close Approximation difficulty: simple Patient tolerance: Patient tolerated the procedure well with no immediate complications.   (including critical care time)  DIAGNOSTIC STUDIES: Oxygen Saturation is 100% on RA, Normal by my interpretation.    COORDINATION OF CARE: 6:37 PM- Will perform laceration repair. Discussed treatment plan with pt at bedside and pt agreed to plan.      Labs Review Labs Reviewed - No data to display Imaging Review No results found.   EKG Interpretation None      MDM   Final  diagnoses:  Toe laceration    86F here with toe lac - tetanus UTD. Repair as above. Absorbable sutures used.  I personally performed the services described in this documentation, which was scribed in my presence. The recorded information has been reviewed and is accurate.  \   Dagmar HaitWilliam Najee Manninen, MD 09/25/13 [phone]

## 2013-11-02 NOTE — Progress Notes (Signed)
   Subjective:    Patient ID: Catherine Conway, female    DOB: 06/30/1961, 53 y.o.   MRN: [id number]  HPI    Review of Systems     Objective:   Physical Exam   Not seen     Assessment & Plan:

## 2013-12-14 ENCOUNTER — Ambulatory Visit (INDEPENDENT_AMBULATORY_CARE_PROVIDER_SITE_OTHER): Payer: 59 | Admitting: Physician Assistant

## 2013-12-14 ENCOUNTER — Encounter: Payer: Self-pay | Admitting: Physician Assistant

## 2013-12-14 VITALS — BP 100/70 | HR 78 | Temp 98.4°F | Resp 18 | Wt 145.0 lb

## 2013-12-14 DIAGNOSIS — J020 Streptococcal pharyngitis: Secondary | ICD-10-CM

## 2013-12-14 DIAGNOSIS — J029 Acute pharyngitis, unspecified: Secondary | ICD-10-CM

## 2013-12-14 LAB — POCT RAPID STREP A (OFFICE): Rapid Strep A Screen: POSITIVE — AB

## 2013-12-14 MED ORDER — PENICILLIN G BENZATHINE 1200000 UNIT/2ML IM SUSP
1.2000 10*6.[IU] | Freq: Once | INTRAMUSCULAR | Status: AC
  Administered 2013-12-14: 1.2 10*6.[IU] via INTRAMUSCULAR

## 2013-12-14 NOTE — Patient Instructions (Addendum)
Because you had the Bicillin shot, there is no need for additional antibiotics.  You may return to work this time tomorrow.  Symptomatic treatment with salt water gargles, throat lozenges, throat sprays.  Drink plenty of water.  Followup as needed, or for worsening or persistent symptoms despite treatment.   Strep Throat Strep throat is an infection of the throat caused by a bacteria named Streptococcus pyogenes. Your caregiver may call the infection streptococcal "tonsillitis" or "pharyngitis" depending on whether there are signs of inflammation in the tonsils or back of the throat. Strep throat is most common in children aged 5 15 years during the cold months of the year, but it can occur in people of any age during any season. This infection is spread from person to person (contagious) through coughing, sneezing, or other close contact. SYMPTOMS   Fever or chills.  Painful, swollen, red tonsils or throat.  Pain or difficulty when swallowing.  White or yellow spots on the tonsils or throat.  Swollen, tender lymph nodes or "glands" of the neck or under the jaw.  Red rash all over the body (rare). DIAGNOSIS  Many different infections can cause the same symptoms. A test must be done to confirm the diagnosis so the right treatment can be given. A "rapid strep test" can help your caregiver make the diagnosis in a few minutes. If this test is not available, a light swab of the infected area can be used for a throat culture test. If a throat culture test is done, results are usually available in a day or two. TREATMENT  Strep throat is treated with antibiotic medicine. HOME CARE INSTRUCTIONS   Gargle with 1 tsp of salt in 1 cup of warm water, 3 4 times per day or as needed for comfort.  Family members who also have a sore throat or fever should be tested for strep throat and treated with antibiotics if they have the strep infection.  Make sure everyone in your household washes their  hands well.  Do not share food, drinking cups, or personal items that could cause the infection to spread to others.  You may need to eat a soft food diet until your sore throat gets better.  Drink enough water and fluids to keep your urine clear or pale yellow. This will help prevent dehydration.  Get plenty of rest.  Stay home from school, daycare, or work until you have been on antibiotics for 24 hours.  Only take over-the-counter or prescription medicines for pain, discomfort, or fever as directed by your caregiver.  If antibiotics are prescribed, take them as directed. Finish them even if you start to feel better. SEEK MEDICAL CARE IF:   The glands in your neck continue to enlarge.  You develop a rash, cough, or earache.  You cough up green, yellow-brown, or bloody sputum.  You have pain or discomfort not controlled by medicines.  Your problems seem to be getting worse rather than better. SEEK IMMEDIATE MEDICAL CARE IF:   You develop any new symptoms such as vomiting, severe headache, stiff or painful neck, chest pain, shortness of breath, or trouble swallowing.  You develop severe throat pain, drooling, or changes in your voice.  You develop swelling of the neck, or the skin on the neck becomes red and tender.  You have a fever.  You develop signs of dehydration, such as fatigue, dry mouth, and decreased urination.  You become increasingly sleepy, or you cannot wake up completely. Document Released: 06/26/2000 Document  Revised: 06/15/2012 Document Reviewed: 08/28/2010 [facility] Center[facility] Patient Information 2014 [place], [place].

## 2013-12-14 NOTE — Progress Notes (Signed)
Pre visit review using our clinic review tool, if applicable. No additional management support is needed unless otherwise documented below in the visit note. 

## 2013-12-14 NOTE — Progress Notes (Signed)
Subjective:    Patient ID: Catherine Conway, female    DOB: 1961-04-18, 53 y.o.   MRN: [id number]  Sore Throat  This is a new problem. The current episode started in the past 7 days. The problem has been gradually improving. Neither side of throat is experiencing more pain than the other. The fever has been present for less than 1 day. The pain is at a severity of 7/10. Associated symptoms include coughing, a hoarse voice, swollen glands and trouble swallowing. Pertinent negatives include no abdominal pain, congestion, diarrhea, drooling, ear discharge, ear pain, headaches, plugged ear sensation, neck pain, shortness of breath, stridor or vomiting. She has had no exposure to strep or mono. Treatments tried: mucinex sever. The treatment provided mild (helped sleep) relief.      Review of Systems  Constitutional: Negative for fever and chills.  HENT: Positive for hoarse voice, postnasal drip, sinus pressure, sore throat and trouble swallowing. Negative for congestion, drooling, ear discharge and ear pain.   Respiratory: Positive for cough. Negative for shortness of breath and stridor.   Gastrointestinal: Negative for nausea, vomiting, abdominal pain and diarrhea.  Musculoskeletal: Negative for neck pain.  Neurological: Negative for headaches.  All other systems reviewed and are negative.    History reviewed. No pertinent past medical history. History reviewed. No pertinent past surgical history.  reports that she has never smoked. She has never used smokeless tobacco. She reports that she does not drink alcohol or use illicit drugs. family history includes Colon cancer (age of onset: 72) in her mother. There is no history of Esophageal cancer, Stomach cancer, or Rectal cancer. No Known Allergies     Objective:   Physical Exam  Nursing note and vitals reviewed. Constitutional: She is oriented to person, place, and time. She appears well-developed and well-nourished. No distress.  HENT:    Head: Normocephalic and atraumatic.  Right Ear: External ear normal.  Left Ear: External ear normal.  Nose: Nose normal.  Mouth/Throat: No oropharyngeal exudate.  Oropharyngeal edema and erythema, no visible exudate. Bilateral TMs normal. Bilateral frontal and maxillary sinuses non-TTP.  Eyes: Conjunctivae and EOM are normal. Pupils are equal, round, and reactive to light.  Neck: Normal range of motion. Neck supple. No JVD present.  Cardiovascular: Normal rate, regular rhythm, normal heart sounds and intact distal pulses.  Exam reveals no gallop and no friction rub.   No murmur heard. Pulmonary/Chest: Effort normal and breath sounds normal. No stridor. No respiratory distress. She has no wheezes. She has no rales. She exhibits no tenderness.  Musculoskeletal: Normal range of motion.  Lymphadenopathy:    She has no cervical adenopathy.  Neurological: She is alert and oriented to person, place, and time.  Skin: Skin is warm and dry. No rash noted. She is not diaphoretic. No erythema. No pallor.  Psychiatric: She has a normal mood and affect. Her behavior is normal. Judgment and thought content normal.    Filed Vitals:   12/14/13 0901  BP: 100/70  Pulse: 78  Temp: 98.4 F (36.9 C)  Resp: 18   Lab Results  Component Value Date   WBC 3.8* 01/11/2013   HGB 12.0 01/11/2013   HCT 36.0 01/11/2013   PLT 278.0 01/11/2013   GLUCOSE 101* 01/11/2013   CHOL 204* 01/11/2013   TRIG 36.0 01/11/2013   HDL 118.20 01/11/2013   LDLDIRECT 58.3 01/11/2013   LDLCALC 54 08/06/2011   ALT 16 01/11/2013   AST 20 01/11/2013   NA 139 01/11/2013  K 5.0 01/11/2013   CL 105 01/11/2013   CREATININE 0.7 01/11/2013   BUN 11 01/11/2013   CO2 25 01/11/2013   TSH 0.90 01/11/2013        Assessment & Plan:  Harriett Sineancy was seen today for sore throat.  Diagnoses and associated orders for this visit:  Streptococcal sore throat Comments: Will treat with Bicillin injection. - penicillin g benzathine (BICILLIN LA) 1200000 UNIT/2ML injection  1.2 Million Units; Inject 2 mLs (1.2 Million Units total) into the muscle once.  Acute pharyngitis - POC Rapid Strep A-   POSITIVE   Plan to follow up as needed, or for worsening or persistent symptoms despite treatment.  Patient Instructions  Because you had the Bicillin shot, there is no need for additional antibiotics.  You may return to work this time tomorrow.  Symptomatic treatment with salt water gargles, throat lozenges, throat sprays.  Drink plenty of water.  Followup as needed, or for worsening or persistent symptoms despite treatment.

## 2014-02-16 ENCOUNTER — Encounter: Payer: 59 | Admitting: Family

## 2014-03-06 ENCOUNTER — Encounter: Payer: Self-pay | Admitting: Family

## 2014-03-06 ENCOUNTER — Ambulatory Visit (INDEPENDENT_AMBULATORY_CARE_PROVIDER_SITE_OTHER): Payer: 59 | Admitting: Family

## 2014-03-06 VITALS — BP 100/80 | HR 61 | Temp 98.2°F | Ht 61.0 in | Wt 145.0 lb

## 2014-03-06 DIAGNOSIS — Z0000 Encounter for general adult medical examination without abnormal findings: Secondary | ICD-10-CM

## 2014-03-06 NOTE — Patient Instructions (Signed)

## 2014-03-06 NOTE — Progress Notes (Signed)
Pre visit review using our clinic review tool, if applicable. No additional management support is needed unless otherwise documented below in the visit note. 

## 2014-03-06 NOTE — Progress Notes (Signed)
Subjective:    Patient ID: Derrill Memo, female    DOB: 01-30-1961, 53 y.o.   MRN: [id number]  HPI 53 year old AAF, nonsmoker, is in today for a CPX. Exercises daily. Sees GYN for female care.  This is a routine wellness  examination for this patient . I reviewed all health maintenance protocols including mammography, bone density Needed referrals were placed. Age and diagnosis  appropriate screening labs were ordered. Her immunization history was reviewed and appropriate vaccinations were ordered. Her current medications and allergies were reviewed and needed refills of her chronic medications were ordered. The plan for yearly health maintenance was discussed all orders and referrals were made as appropriate.   Review of Systems  Constitutional: Negative.   HENT: Negative.   Eyes: Negative.   Respiratory: Negative.   Cardiovascular: Negative.   Gastrointestinal: Negative.   Endocrine: Negative.   Genitourinary: Negative.   Musculoskeletal: Negative.   Skin: Negative.   Allergic/Immunologic: Negative.   Neurological: Negative.   Hematological: Negative.   Psychiatric/Behavioral: Negative.    History reviewed. No pertinent past medical history.  History   Social History  . Marital Status: Single    Spouse Name: N/A    Number of Children: N/A  . Years of Education: N/A   Occupational History  . Not on file.   Social History Main Topics  . Smoking status: Never Smoker   . Smokeless tobacco: Never Used  . Alcohol Use: No  . Drug Use: No  . Sexual Activity: Not on file   Other Topics Concern  . Not on file   Social History Narrative  . No narrative on file    History reviewed. No pertinent past surgical history.  Family History  Problem Relation Age of Onset  . Colon cancer Mother 74  . Esophageal cancer Neg Hx   . Stomach cancer Neg Hx   . Rectal cancer Neg Hx     No Known Allergies  Current Outpatient Prescriptions on File Prior to Visit  Medication Sig  Dispense Refill  . Ascorbic Acid (VITAMIN C PO) Take by mouth daily.      . Cholecalciferol (VITAMIN D PO) Take by mouth daily.      . Glucosamine HCl (GLUCOSAMINE PO) Take by mouth daily.      . NON FORMULARY Take by mouth daily. NutraLite Multivitamins.      . NON FORMULARY Fruit and vegetables 2 tablets daily      . NON FORMULARY Osha essential health 1 tablet daily       No current facility-administered medications on file prior to visit.    BP 100/80  Pulse 61  Temp(Src) 98.2 F (36.8 C) (Oral)  Ht  (1.549 m)  Wt 145 lb (65.772 kg)  BMI 27.41 kg/m2  SpO2 100%chart    Objective:   Physical Exam  Constitutional: She is oriented to person, place, and time. She appears well-developed and well-nourished.  HENT:  Head: Normocephalic and atraumatic.  Right Ear: External ear normal.  Left Ear: External ear normal.  Nose: Nose normal.  Mouth/Throat: Oropharynx is clear and moist.  Eyes: Conjunctivae and EOM are normal. Pupils are equal, round, and reactive to light.  Neck: Normal range of motion. Neck supple. No thyromegaly present.  Cardiovascular: Normal rate, regular rhythm and normal heart sounds.   Pulmonary/Chest: Effort normal and breath sounds normal.  Abdominal: Soft. Bowel sounds are normal. She exhibits no distension. There is no tenderness. There is no rebound.  Musculoskeletal:  Normal range of motion.  Neurological: She is alert and oriented to person, place, and time. She has normal reflexes. No cranial nerve deficit. Coordination normal.  Skin: Skin is warm and dry.  Psychiatric: She has a normal mood and affect.          Assessment & Plan:  Perl was seen today for annual exam.  Diagnoses and associated orders for this visit:  Preventative health care   Call the office with any questions or concerns. Recheck in 1 year and sooner as needed. Monthly self breast exams.

## 2014-07-20 ENCOUNTER — Other Ambulatory Visit (HOSPITAL_BASED_OUTPATIENT_CLINIC_OR_DEPARTMENT_OTHER): Payer: Self-pay | Admitting: Obstetrics and Gynecology

## 2014-07-20 DIAGNOSIS — Z1231 Encounter for screening mammogram for malignant neoplasm of breast: Secondary | ICD-10-CM

## 2014-08-11 ENCOUNTER — Ambulatory Visit (HOSPITAL_BASED_OUTPATIENT_CLINIC_OR_DEPARTMENT_OTHER): Payer: Self-pay

## 2014-08-13 ENCOUNTER — Ambulatory Visit (HOSPITAL_BASED_OUTPATIENT_CLINIC_OR_DEPARTMENT_OTHER)
Admission: RE | Admit: 2014-08-13 | Discharge: 2014-08-13 | Disposition: A | Discharge status: Home / Self Care | Payer: 59 | Source: Ambulatory Visit | Attending: Obstetrics and Gynecology | Admitting: Obstetrics and Gynecology

## 2014-08-13 ENCOUNTER — Ambulatory Visit (HOSPITAL_BASED_OUTPATIENT_CLINIC_OR_DEPARTMENT_OTHER): Payer: Self-pay

## 2014-08-13 DIAGNOSIS — Z1231 Encounter for screening mammogram for malignant neoplasm of breast: Secondary | ICD-10-CM | POA: Diagnosis present

## 2014-12-28 ENCOUNTER — Ambulatory Visit (INDEPENDENT_AMBULATORY_CARE_PROVIDER_SITE_OTHER): Payer: 59 | Admitting: Family Medicine

## 2014-12-28 ENCOUNTER — Encounter: Payer: Self-pay | Admitting: Family Medicine

## 2014-12-28 VITALS — BP 108/72 | HR 61 | Temp 98.3°F | Ht 61.25 in | Wt 146.5 lb

## 2014-12-28 DIAGNOSIS — Z6827 Body mass index (BMI) 27.0-27.9, adult: Secondary | ICD-10-CM

## 2014-12-28 DIAGNOSIS — Z7189 Other specified counseling: Secondary | ICD-10-CM

## 2014-12-28 DIAGNOSIS — Z800 Family history of malignant neoplasm of digestive organs: Secondary | ICD-10-CM | POA: Diagnosis not present

## 2014-12-28 DIAGNOSIS — Z7689 Persons encountering health services in other specified circumstances: Secondary | ICD-10-CM

## 2014-12-28 NOTE — Progress Notes (Signed)
HPI:  Catherine Conway is here to establish care. Used to see [facility]. Last PCP and physical: with [facility] in 02/2014. Sees gyn for gyn/breast health - Dr. Lowell Guitar.  Has the following chronic problems that require follow up and concerns today:  FH Colon Cancer: -mother, age 52 -had colonoscopy 2 years ago normal and told repeat in 10 years -denies: blood in stools, GI complaints, change sin bowels   ROS negative for unless reported above: fevers, unintentional weight loss, hearing or vision loss, chest pain, palpitations, struggling to breath, hemoptysis, melena, hematochezia, hematuria, falls, loc, si, thoughts of self harm  History reviewed. No pertinent past medical history.  History reviewed. No pertinent past surgical history.  Family History  Problem Relation Age of Onset  . Colon cancer Mother 53  . Esophageal cancer Neg Hx   . Stomach cancer Neg Hx   . Rectal cancer Neg Hx     History   Social History  . Marital Status: Single    Spouse Name: N/A  . Number of Children: N/A  . Years of Education: N/A   Social History Main Topics  . Smoking status: Never Smoker   . Smokeless tobacco: Never Used  . Alcohol Use: No  . Drug Use: No  . Sexual Activity: Not on file   Other Topics Concern  . 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  Ascorbic Acid (VITAMIN C PO), Take by mouth daily., Disp: , Rfl:  .  Cholecalciferol (VITAMIN D PO), Take by mouth daily., Disp: , Rfl:  .  Glucosamine HCl (GLUCOSAMINE PO), Take by mouth daily., Disp: , Rfl:  .  NON FORMULARY, Take by mouth daily. NutraLite Multivitamins., Disp: , Rfl:  .  NON FORMULARY, Fruit and vegetables 2 tablets daily, Disp: , Rfl:  .  NON FORMULARY, [facility]sha essential  health 1 tablet daily, Disp: , Rfl:   EXAM:  Filed Vitals:   12/28/14 1438  BP: 108/72  Pulse: 61  Temp: 98.3 F (36.8 C)    Body mass index is 27.45 kg/(m^2).  GENERAL: vitals reviewed and listed above, alert, oriented, appears well hydrated and in no acute distress  HEENT: atraumatic, conjunttiva clear, no obvious abnormalities on inspection of external nose and ears  NECK: no obvious masses on inspection  LUNGS: clear to auscultation bilaterally, no wheezes, rales or rhonchi, good air movement  CV: HRRR, no peripheral edema  SKIN: dark nevus on R wrist  MS: moves all extremities without noticeable abnormality  PSYCH: pleasant and cooperative, no obvious depression or anxiety  ASSESSMENT AND PLAN:  Discussed the following assessment and plan:  Encounter to establish care -We reviewed the PMH, PSH, FH, SH, Meds and Allergies. -We provided refills for any medications we will prescribe as needed. -We addressed current concerns per orders and patient instructions. -We have asked for records for pertinent exams, studies, vaccines and notes from previous providers. -We have advised patient to follow up per instructions below. -reviewed outside labs - cholesterol and Blood glucose normal in April 2016 -follow up in 1 year  FH: colon cancer -follow up per GI  BMI 27.0-27.9,adult -lifestyle recs  Advised derm eval  of the lesion on her arm, she sees a dermatologist and agreed to do this. Reports she has had this forever and is unchanged.    -Patient advised to return or notify a doctor immediately if symptoms worsen or persist or new concerns arise.  Patient Instructions  BEFORE YOU LEAVE: -schedule physical in 1 year  We recommend the following healthy lifestyle measures: - eat a healthy diet consisting of lots of vegetables, fruits, beans, nuts, seeds, healthy meats such as white chicken and fish and whole grains.  - avoid fried foods, fast food, processed  foods, sodas, red meet and other fattening foods.  - get a least 150 minutes of aerobic exercise per week.       Kriste Basque R.

## 2014-12-28 NOTE — Patient Instructions (Signed)
BEFORE YOU LEAVE: -schedule physical in 1 year  We recommend the following healthy lifestyle measures: - eat a healthy diet consisting of lots of vegetables, fruits, beans, nuts, seeds, healthy meats such as white chicken and fish and whole grains.  - avoid fried foods, fast food, processed foods, sodas, red meet and other fattening foods.  - get a least 150 minutes of aerobic exercise per week.

## 2014-12-28 NOTE — Progress Notes (Signed)
Pre visit review using our clinic review tool, if applicable. No additional management support is needed unless otherwise documented below in the visit note. 

## 2015-07-31 ENCOUNTER — Other Ambulatory Visit (HOSPITAL_BASED_OUTPATIENT_CLINIC_OR_DEPARTMENT_OTHER): Payer: Self-pay | Admitting: Obstetrics and Gynecology

## 2015-07-31 DIAGNOSIS — Z1231 Encounter for screening mammogram for malignant neoplasm of breast: Secondary | ICD-10-CM

## 2015-08-15 ENCOUNTER — Ambulatory Visit (HOSPITAL_BASED_OUTPATIENT_CLINIC_OR_DEPARTMENT_OTHER)
Admission: RE | Admit: 2015-08-15 | Discharge: 2015-08-15 | Disposition: A | Discharge status: Home / Self Care | Payer: 59 | Source: Ambulatory Visit | Attending: Obstetrics and Gynecology | Admitting: Obstetrics and Gynecology

## 2015-08-15 DIAGNOSIS — Z1231 Encounter for screening mammogram for malignant neoplasm of breast: Secondary | ICD-10-CM | POA: Insufficient documentation

## 2015-09-02 ENCOUNTER — Encounter: Payer: Self-pay | Admitting: Family Medicine

## 2015-09-02 ENCOUNTER — Ambulatory Visit (INDEPENDENT_AMBULATORY_CARE_PROVIDER_SITE_OTHER): Payer: 59 | Admitting: Family Medicine

## 2015-09-02 VITALS — BP 142/80 | HR 79 | Temp 98.8°F | Ht 61.25 in | Wt 146.2 lb

## 2015-09-02 DIAGNOSIS — J309 Allergic rhinitis, unspecified: Secondary | ICD-10-CM

## 2015-09-02 MED ORDER — FLUTICASONE PROPIONATE 50 MCG/ACT NA SUSP: 2.0000 | Freq: Every day | NASAL | Status: DC

## 2015-09-02 NOTE — Progress Notes (Signed)
HPI:  Nasal congestion: -started: about 1 week ago -symptoms:nasal congestion, sneezing, postnasal drip, itchy and watery eyes -denies:fever, SOB, NVD, tooth pain -has tried: Over-the-counter Tylenol Cold and flu -sick contacts/travel/risks: denies flu exposure   ROS: See pertinent positives and negatives per HPI.  No past medical history on file.  No past surgical history on file.  Family History  Problem Relation Age of Onset  . Colon cancer Mother 27  . Esophageal cancer Neg Hx   . Stomach cancer Neg Hx   . Rectal cancer Neg Hx     Social History   Social History  . Marital Status: Single    Spouse Name: N/A  . Number of Children: N/A  . Years of Education: N/A   Social History Main Topics  . Smoking status: Never Smoker   . Smokeless tobacco: Never Used  . Alcohol Use: No  . Drug Use: No  . Sexual Activity: Not Asked   Other Topics Concern  . 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  Ascorbic Acid (VITAMIN C PO), Take by mouth daily., Disp: , Rfl:  .  Cholecalciferol (VITAMIN D PO), Take by mouth daily., Disp: , Rfl:  .  Glucosamine HCl (GLUCOSAMINE PO), Take by mouth daily., Disp: , Rfl:  .  NON FORMULARY, Take by mouth daily. NutraLite Multivitamins., Disp: , Rfl:  .  NON FORMULARY, Fruit and vegetables 2 tablets daily, Disp: , Rfl:  .  NON FORMULARY, [employer] essential health 1 tablet daily, Disp: , Rfl:  .  fluticasone (FLONASE) 50 MCG/ACT nasal spray, Place 2 sprays into both nostrils daily., Disp: 16 g, Rfl: 6  EXAM:  Filed Vitals:   09/02/15 1610  BP: 142/80  Pulse: 79  Temp: 98.8 F (37.1 C)    Body mass index is 27.39 kg/(m^2).  GENERAL: vitals reviewed and listed above, alert, oriented, appears well  hydrated and in no acute distress  HEENT: atraumatic, conjunttiva clear, no obvious abnormalities on inspection of external nose and ears, normal appearance of ear canals and TMs, clear nasal congestion with pale boggy turbinates, mild post oropharyngeal erythema with PND, no tonsillar edema or exudate, no sinus TTP  NECK: no obvious masses on inspection  LUNGS: clear to auscultation bilaterally, no wheezes, rales or rhonchi, good air movement  CV: HRRR, no peripheral edema  MS: moves all extremities without noticeable abnormality  PSYCH: pleasant and cooperative, no obvious depression or anxiety  ASSESSMENT AND PLAN:  Discussed the following assessment and plan:  Allergic rhinitis, unspecified allergic rhinitis type  -given HPI and exam findings today, a serious infection or illness is unlikely. We discussed potential etiologies, with allergic rhinitis being most likely. We discussed treatment side effects, likely course, antibiotic misuse, transmission, and signs of developing a serious illness. -Start with short course nasal decongestion, intranasal steroid and antihistamine -of course, we advised to return or notify a doctor immediately if symptoms worsen or persist or new concerns arise.    Patient Instructions  Please use Afrin nasal spray twice daily for 3 days, then stop. This medication is available over-the-counter.  Please start Flonase nasal spray 2 sprays each nostril for 21 days,  then you can go to 1 spray each nostril daily.  Please start Allegra or Claritin, this is available over-the-counter -take once daily.  Please follow up if symptoms worsen, new symptoms occur or if symptoms are not responding to treatment.     Kriste Basque R.

## 2015-09-02 NOTE — Patient Instructions (Signed)
Please use Afrin nasal spray twice daily for 3 days, then stop. This medication is available over-the-counter.  Please start Flonase nasal spray 2 sprays each nostril for 21 days, then you can go to 1 spray each nostril daily.  Please start Allegra or Claritin, this is available over-the-counter -take once daily.  Please follow up if symptoms worsen, new symptoms occur or if symptoms are not responding to treatment.

## 2015-09-02 NOTE — Progress Notes (Signed)
Pre visit review using our clinic review tool, if applicable. No additional management support is needed unless otherwise documented below in the visit note. 

## 2015-10-28 LAB — HM PAP SMEAR

## 2015-10-30 ENCOUNTER — Encounter: Payer: Self-pay | Admitting: Family Medicine

## 2016-07-20 ENCOUNTER — Other Ambulatory Visit (HOSPITAL_BASED_OUTPATIENT_CLINIC_OR_DEPARTMENT_OTHER): Payer: Self-pay | Admitting: Obstetrics and Gynecology

## 2016-07-20 DIAGNOSIS — Z1231 Encounter for screening mammogram for malignant neoplasm of breast: Secondary | ICD-10-CM

## 2016-08-17 ENCOUNTER — Ambulatory Visit (HOSPITAL_BASED_OUTPATIENT_CLINIC_OR_DEPARTMENT_OTHER)
Admission: RE | Admit: 2016-08-17 | Discharge: 2016-08-17 | Disposition: A | Discharge status: Home / Self Care | Payer: 59 | Source: Ambulatory Visit | Attending: Obstetrics and Gynecology | Admitting: Obstetrics and Gynecology

## 2016-08-17 ENCOUNTER — Encounter (HOSPITAL_BASED_OUTPATIENT_CLINIC_OR_DEPARTMENT_OTHER): Payer: Self-pay

## 2016-08-17 DIAGNOSIS — Z1231 Encounter for screening mammogram for malignant neoplasm of breast: Secondary | ICD-10-CM | POA: Diagnosis not present

## 2016-08-25 DIAGNOSIS — Z719 Counseling, unspecified: Secondary | ICD-10-CM | POA: Diagnosis not present

## 2016-09-01 DIAGNOSIS — Z719 Counseling, unspecified: Secondary | ICD-10-CM | POA: Diagnosis not present

## 2016-09-08 DIAGNOSIS — Z719 Counseling, unspecified: Secondary | ICD-10-CM | POA: Diagnosis not present

## 2016-09-15 DIAGNOSIS — Z719 Counseling, unspecified: Secondary | ICD-10-CM | POA: Diagnosis not present

## 2016-09-22 DIAGNOSIS — Z719 Counseling, unspecified: Secondary | ICD-10-CM | POA: Diagnosis not present

## 2016-10-29 DIAGNOSIS — Z124 Encounter for screening for malignant neoplasm of cervix: Secondary | ICD-10-CM | POA: Diagnosis not present

## 2016-10-29 DIAGNOSIS — Z01419 Encounter for gynecological examination (general) (routine) without abnormal findings: Secondary | ICD-10-CM | POA: Diagnosis not present

## 2017-01-11 NOTE — Progress Notes (Signed)
HPI:  Here for CPE:  -Concerns and/or follow up today: none Sees Dr. Lowell Guitar, gynecology, for women's health exams - last exam 10/2016. Reports had pap and mammo and these were normal. -Diet: variety of foods, balance and well rounded, -Exercise:  regular exercise  -Taking folic acid, vitamin D or calcium: yes taking vit D  -Diabetes and Dyslipidemia Screening: fasting for labs today  -Vaccines: UTD  -sexual activity: yes, female partner, no new partners  -wants STI testing (Hep C if born [birth year]): agrees to hep c screen  -FH breast, colon or ovarian ca: see FH Last mammogram: UTD - reports done and normal spring 2018 Last colon cancer screening: 2014 per prior report told repeat in 10 years, mother with colon ca at age 54  -Alcohol, Tobacco, drug use: see social history  Review of Systems - no fevers, unintentional weight loss, vision loss, hearing loss, chest pain, sob, hemoptysis, melena, hematochezia, hematuria, genital discharge, changing or concerning skin lesions, bleeding, bruising, loc, thoughts of self harm or SI  No past medical history on file.  No past surgical history on file.  Family History  Problem Relation Age of Onset  . Colon cancer Mother 32  . Esophageal cancer Neg Hx   . Stomach cancer Neg Hx   . Rectal cancer Neg Hx     Social History   Social History  . Marital status: Single    Spouse name: N/A  . Number of children: N/A  . Years of education: N/A   Social History Main Topics  . Smoking status: Never Smoker  . Smokeless tobacco: Never Used  . Alcohol use No  . Drug use: No  . Sexual activity: Not Asked   Other Topics Concern  . 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  Ascorbic Acid (VITAMIN C  PO), Take by mouth daily., Disp: , Rfl:  .  Cholecalciferol (VITAMIN D PO), Take by mouth daily., Disp: , Rfl:  .  fluticasone (FLONASE) 50 MCG/ACT nasal spray, Place 2 sprays into both nostrils daily., Disp: 16 g, Rfl: 6 .  Glucosamine HCl (GLUCOSAMINE PO), Take by mouth daily., Disp: , Rfl:  .  NON FORMULARY, Take by mouth daily. NutraLite Multivitamins., Disp: , Rfl:  .  NON FORMULARY, Fruit and vegetables 2 tablets daily, Disp: , Rfl:  .  NON FORMULARY, [employer] essential health 1 tablet daily, Disp: , Rfl:   EXAM:  Vitals:   01/12/17 0808  BP: 98/70  Pulse: (!) 59  Temp: 97.8 F (36.6 C)    GENERAL: vitals reviewed and listed below, alert, oriented, appears well hydrated and in no acute distress  HEENT: head atraumatic, PERRLA, normal appearance of eyes, ears, nose and mouth. moist mucus membranes.  NECK: supple, no masses or lymphadenopathy  LUNGS: clear to auscultation bilaterally, no rales, rhonchi or wheeze  CV: HRRR, no peripheral edema or cyanosis, normal pedal pulses  ABDOMEN: bowel sounds normal, soft, non tender to palpation, no masses, no rebound or guarding  SKIN: no rash or abnormal lesions - declines full skin exam  GU/BREAST: declined - does with gyn  MS: normal gait, moves all extremities normally  NEURO: normal  gait, speech and thought processing grossly intact, muscle tone grossly intact throughout  PSYCH: normal affect, pleasant and cooperative  ASSESSMENT AND PLAN:  Encounter for preventive health examination  Encounter for HCV screening test for high risk patient  BMI 27.0-27.9,adult  -Discussed and advised all [place] preventive services health task force level A and B recommendations for age, sex and risks.  -Advised at least 150 minutes of exercise per week and a healthy diet with avoidance of (less then 1 serving per week) processed foods, white starches, red meat, fast foods and sweets and consisting of: * 5-9 servings of fresh fruits and  vegetables (not corn or potatoes) *nuts and seeds, beans *olives and olive oil *lean meats such as fish and white chicken  *whole grains  -FASTING labs, studies and vaccines per orders this encounter  No orders of the defined types were placed in this encounter.   Patient advised to return to clinic immediately if symptoms worsen or persist or new concerns.  Patient Instructions  BEFORE YOU LEAVE: -follow up: yearly for CPE -labs  We have ordered labs or studies at this visit. It can take up to 1-2 weeks for results and processing. IF results require follow up or explanation, we will call you with instructions. Clinically stable results will be released to your [facility]. If you have not heard from [place] or cannot find your results in [facility] in 2 weeks please contact our office at [phone].  If you are not yet signed up for [facility], please consider signing up.   Health Maintenance Menopause is a normal process in which your reproductive ability comes to an end. This process happens gradually over a span of months to years, usually between the ages of 65 and 74. Menopause is complete when you have missed 12 consecutive menstrual periods. It is important to talk with your health care provider about some of the most common conditions that affect postmenopausal women, such as heart disease, cancer, and bone loss (osteoporosis). Adopting a healthy lifestyle and getting preventive care can help to promote your health and wellness. Those actions can also lower your chances of developing some of these common conditions. What should I know about menopause? During menopause, you may experience a number of symptoms, such as:  Moderate-to-severe hot flashes.  Night sweats.  Decrease in sex drive.  Mood swings.  Headaches.  Tiredness.  Irritability.  Memory problems.  Insomnia.  Choosing to treat or not to treat menopausal changes is an individual decision that you make with your  health care provider. What should I know about hormone replacement therapy and supplements? Hormone therapy products are effective for treating symptoms that are associated with menopause, such as hot flashes and night sweats. Hormone replacement carries certain risks, especially as you become older. If you are thinking about using estrogen or estrogen with progestin treatments, discuss the benefits and risks with your health care provider. What should I know about heart disease and stroke? Heart disease, heart attack, and stroke become more likely as you age. This may be due, in part, to the hormonal changes that your body experiences during menopause. These can affect how your body processes dietary fats, triglycerides, and cholesterol. Heart attack and stroke are both medical emergencies. There are many things that you can do to help prevent heart disease and stroke:  Have your blood pressure checked at least every 1-2 years. High blood pressure causes heart disease and increases the risk of stroke.  If you are 55-79 years  old, ask your health care provider if you should take aspirin to prevent a heart attack or a stroke.  Do not use any tobacco products, including cigarettes, chewing tobacco, or electronic cigarettes. If you need help quitting, ask your health care provider.  It is important to eat a healthy diet and maintain a healthy weight. ? Be sure to include plenty of vegetables, fruits, low-fat dairy products, and lean protein. ? Avoid eating foods that are high in solid fats, added sugars, or salt (sodium).  Get regular exercise. This is one of the most important things that you can do for your health. ? Try to exercise for at least 150 minutes each week. The type of exercise that you do should increase your heart rate and make you sweat. This is known as moderate-intensity exercise. ? Try to do strengthening exercises at least twice each week. Do these in addition to the  moderate-intensity exercise.  Know your numbers.Ask your health care provider to check your cholesterol and your blood glucose. Continue to have your blood tested as directed by your health care provider.  What should I know about cancer screening? There are several types of cancer. Take the following steps to reduce your risk and to catch any cancer development as early as possible. Breast Cancer  Practice breast self-awareness. ? This means understanding how your breasts normally appear and feel. ? It also means doing regular breast self-exams. Let your health care provider know about any changes, no matter how small.  If you are 28 or older, have a clinician do a breast exam (clinical breast exam or CBE) every year. Depending on your age, family history, and medical history, it may be recommended that you also have a yearly breast X-ray (mammogram).  If you have a family history of breast cancer, talk with your health care provider about genetic screening.  If you are at high risk for breast cancer, talk with your health care provider about having an MRI and a mammogram every year.  Breast cancer (BRCA) gene test is recommended for women who have family members with BRCA-related cancers. Results of the assessment will determine the need for genetic counseling and BRCA1 and for BRCA2 testing. BRCA-related cancers include these types: ? Breast. This occurs in males or females. ? Ovarian. ? Tubal. This may also be called fallopian tube cancer. ? Cancer of the abdominal or pelvic lining (peritoneal cancer). ? Prostate. ? Pancreatic.  Cervical, Uterine, and Ovarian Cancer Your health care provider may recommend that you be screened regularly for cancer of the pelvic organs. These include your ovaries, uterus, and vagina. This screening involves a pelvic exam, which includes checking for microscopic changes to the surface of your cervix (Pap test).  For women ages 21-65, health care  providers may recommend a pelvic exam and a Pap test every three years. For women ages 49-65, they may recommend the Pap test and pelvic exam, combined with testing for human papilloma virus (HPV), every five years. Some types of HPV increase your risk of cervical cancer. Testing for HPV may also be done on women of any age who have unclear Pap test results.  Other health care providers may not recommend any screening for nonpregnant women who are considered low risk for pelvic cancer and have no symptoms. Ask your health care provider if a screening pelvic exam is right for you.  If you have had past treatment for cervical cancer or a condition that could lead to cancer, you need  Pap tests and screening for cancer for at least 20 years after your treatment. If Pap tests have been discontinued for you, your risk factors (such as having a new sexual partner) need to be reassessed to determine if you should start having screenings again. Some women have medical problems that increase the chance of getting cervical cancer. In these cases, your health care provider may recommend that you have screening and Pap tests more often.  If you have a family history of uterine cancer or ovarian cancer, talk with your health care provider about genetic screening.  If you have vaginal bleeding after reaching menopause, tell your health care provider.  There are currently no reliable tests available to screen for ovarian cancer.  Lung Cancer Lung cancer screening is recommended for adults 36-85 years old who are at high risk for lung cancer because of a history of smoking. A yearly low-dose CT scan of the lungs is recommended if you:  Currently smoke.  Have a history of at least 30 pack-years of smoking and you currently smoke or have quit within the past 15 years. A pack-year is smoking an average of one pack of cigarettes per day for one year.  Yearly screening should:  Continue until it has been 15 years  since you quit.  Stop if you develop a health problem that would prevent you from having lung cancer treatment.  Colorectal Cancer  This type of cancer can be detected and can often be prevented.  Routine colorectal cancer screening usually begins at age 39 and continues through age 8.  If you have risk factors for colon cancer, your health care provider may recommend that you be screened at an earlier age.  If you have a family history of colorectal cancer, talk with your health care provider about genetic screening.  Your health care provider may also recommend using home test kits to check for hidden blood in your stool.  A small camera at the end of a tube can be used to examine your colon directly (sigmoidoscopy or colonoscopy). This is done to check for the earliest forms of colorectal cancer.  Direct examination of the colon should be repeated every 5-10 years until age 21. However, if early forms of precancerous polyps or small growths are found or if you have a family history or genetic risk for colorectal cancer, you may need to be screened more often.  Skin Cancer  Check your skin from head to toe regularly.  Monitor any moles. Be sure to tell your health care provider: ? About any new moles or changes in moles, especially if there is a change in a mole's shape or color. ? If you have a mole that is larger than the size of a pencil eraser.  If any of your family members has a history of skin cancer, especially at a young age, talk with your health care provider about genetic screening.  Always use sunscreen. Apply sunscreen liberally and repeatedly throughout the day.  Whenever you are outside, protect yourself by wearing long sleeves, pants, a wide-brimmed hat, and sunglasses.  What should I know about osteoporosis? Osteoporosis is a condition in which bone destruction happens more quickly than new bone creation. After menopause, you may be at an increased risk for  osteoporosis. To help prevent osteoporosis or the bone fractures that can happen because of osteoporosis, the following is recommended:  If you are 36-54 years old, get at least 1,000 mg of calcium and at least 600  mg of vitamin D per day.  If you are older than age 33 but younger than age 90, get at least 1,200 mg of calcium and at least 600 mg of vitamin D per day.  If you are older than age 79, get at least 1,200 mg of calcium and at least 800 mg of vitamin D per day.  Smoking and excessive alcohol intake increase the risk of osteoporosis. Eat foods that are rich in calcium and vitamin D, and do weight-bearing exercises several times each week as directed by your health care provider. What should I know about how menopause affects my mental health? Depression may occur at any age, but it is more common as you become older. Common symptoms of depression include:  Low or sad mood.  Changes in sleep patterns.  Changes in appetite or eating patterns.  Feeling an overall lack of motivation or enjoyment of activities that you previously enjoyed.  Frequent crying spells.  Talk with your health care provider if you think that you are experiencing depression. What should I know about immunizations? It is important that you get and maintain your immunizations. These include:  Tetanus, diphtheria, and pertussis (Tdap) booster vaccine.  Influenza every year before the flu season begins.  Pneumonia vaccine.  Shingles vaccine.  Your health care provider may also recommend other immunizations. This information is not intended to replace advice given to you by your health care provider. Make sure you discuss any questions you have with your health care provider. Document Released: 08/21/2005 Document Revised: 01/17/2016 Document Reviewed: 04/02/2015 Elsevier Interactive Patient Education  2018 [employer].          No Follow-up on file.  Colin Benton R., DO

## 2017-01-12 ENCOUNTER — Ambulatory Visit (INDEPENDENT_AMBULATORY_CARE_PROVIDER_SITE_OTHER): Payer: 59 | Admitting: Family Medicine

## 2017-01-12 ENCOUNTER — Encounter: Payer: Self-pay | Admitting: Family Medicine

## 2017-01-12 VITALS — BP 98/70 | HR 59 | Temp 97.8°F | Ht 61.25 in | Wt 146.2 lb

## 2017-01-12 DIAGNOSIS — Z6827 Body mass index (BMI) 27.0-27.9, adult: Secondary | ICD-10-CM | POA: Diagnosis not present

## 2017-01-12 DIAGNOSIS — Z0000 Encounter for general adult medical examination without abnormal findings: Secondary | ICD-10-CM

## 2017-01-12 DIAGNOSIS — Z9189 Other specified personal risk factors, not elsewhere classified: Secondary | ICD-10-CM | POA: Diagnosis not present

## 2017-01-12 DIAGNOSIS — D72819 Decreased white blood cell count, unspecified: Secondary | ICD-10-CM | POA: Insufficient documentation

## 2017-01-12 DIAGNOSIS — Z1159 Encounter for screening for other viral diseases: Secondary | ICD-10-CM

## 2017-01-12 LAB — LIPID PANEL
CHOL/HDL RATIO: 2
Cholesterol: 167 mg/dL (ref 0–200)
HDL: 92.4 mg/dL (ref >39.00)
LDL Cholesterol: 62 mg/dL (ref 0–99)
NonHDL: 74.23
Triglycerides: 60 mg/dL (ref 0.0–149.0)
VLDL: 12 mg/dL (ref 0.0–40.0)

## 2017-01-12 LAB — HEPATITIS C ANTIBODY: HCV Ab: NEGATIVE

## 2017-01-12 LAB — HEMOGLOBIN A1C: Hgb A1c MFr Bld: 6.1 % (ref 4.6–6.5)

## 2017-01-12 NOTE — Patient Instructions (Addendum)
BEFORE YOU LEAVE: -follow up: yearly for CPE -labs  We have ordered labs or studies at this visit. It can take up to 1-2 weeks for results and processing. IF results require follow up or explanation, we will call you with instructions. Clinically stable results will be released to your [facility]. If you have not heard from [place] or cannot find your results in [facility] in 2 weeks please contact our office at [phone].  If you are not yet signed up for [facility], please consider signing up.   Health Maintenance Menopause is a normal process in which your reproductive ability comes to an end. This process happens gradually over a span of months to years, usually between the ages of 41 and 105. Menopause is complete when you have missed 12 consecutive menstrual periods. It is important to talk with your health care provider about some of the most common conditions that affect postmenopausal women, such as heart disease, cancer, and bone loss (osteoporosis). Adopting a healthy lifestyle and getting preventive care can help to promote your health and wellness. Those actions can also lower your chances of developing some of these common conditions. What should I know about menopause? During menopause, you may experience a number of symptoms, such as:  Moderate-to-severe hot flashes.  Night sweats.  Decrease in sex drive.  Mood swings.  Headaches.  Tiredness.  Irritability.  Memory problems.  Insomnia.  Choosing to treat or not to treat menopausal changes is an individual decision that you make with your health care provider. What should I know about hormone replacement therapy and supplements? Hormone therapy products are effective for treating symptoms that are associated with menopause, such as hot flashes and night sweats. Hormone replacement carries certain risks, especially as you become older. If you are thinking about using estrogen or estrogen with progestin treatments, discuss the  benefits and risks with your health care provider. What should I know about heart disease and stroke? Heart disease, heart attack, and stroke become more likely as you age. This may be due, in part, to the hormonal changes that your body experiences during menopause. These can affect how your body processes dietary fats, triglycerides, and cholesterol. Heart attack and stroke are both medical emergencies. There are many things that you can do to help prevent heart disease and stroke:  Have your blood pressure checked at least every 1-2 years. High blood pressure causes heart disease and increases the risk of stroke.  If you are 77-18 years old, ask your health care provider if you should take aspirin to prevent a heart attack or a stroke.  Do not use any tobacco products, including cigarettes, chewing tobacco, or electronic cigarettes. If you need help quitting, ask your health care provider.  It is important to eat a healthy diet and maintain a healthy weight. ? Be sure to include plenty of vegetables, fruits, low-fat dairy products, and lean protein. ? Avoid eating foods that are high in solid fats, added sugars, or salt (sodium).  Get regular exercise. This is one of the most important things that you can do for your health. ? Try to exercise for at least 150 minutes each week. The type of exercise that you do should increase your heart rate and make you sweat. This is known as moderate-intensity exercise. ? Try to do strengthening exercises at least twice each week. Do these in addition to the moderate-intensity exercise.  Know your numbers.Ask your health care provider to check your cholesterol and your blood glucose.  Continue to have your blood tested as directed by your health care provider.  What should I know about cancer screening? There are several types of cancer. Take the following steps to reduce your risk and to catch any cancer development as early as possible. Breast  Cancer  Practice breast self-awareness. ? This means understanding how your breasts normally appear and feel. ? It also means doing regular breast self-exams. Let your health care provider know about any changes, no matter how small.  If you are 47 or older, have a clinician do a breast exam (clinical breast exam or CBE) every year. Depending on your age, family history, and medical history, it may be recommended that you also have a yearly breast X-ray (mammogram).  If you have a family history of breast cancer, talk with your health care provider about genetic screening.  If you are at high risk for breast cancer, talk with your health care provider about having an MRI and a mammogram every year.  Breast cancer (BRCA) gene test is recommended for women who have family members with BRCA-related cancers. Results of the assessment will determine the need for genetic counseling and BRCA1 and for BRCA2 testing. BRCA-related cancers include these types: ? Breast. This occurs in males or females. ? Ovarian. ? Tubal. This may also be called fallopian tube cancer. ? Cancer of the abdominal or pelvic lining (peritoneal cancer). ? Prostate. ? Pancreatic.  Cervical, Uterine, and Ovarian Cancer Your health care provider may recommend that you be screened regularly for cancer of the pelvic organs. These include your ovaries, uterus, and vagina. This screening involves a pelvic exam, which includes checking for microscopic changes to the surface of your cervix (Pap test).  For women ages 21-65, health care providers may recommend a pelvic exam and a Pap test every three years. For women ages 80-65, they may recommend the Pap test and pelvic exam, combined with testing for human papilloma virus (HPV), every five years. Some types of HPV increase your risk of cervical cancer. Testing for HPV may also be done on women of any age who have unclear Pap test results.  Other health care providers may not  recommend any screening for nonpregnant women who are considered low risk for pelvic cancer and have no symptoms. Ask your health care provider if a screening pelvic exam is right for you.  If you have had past treatment for cervical cancer or a condition that could lead to cancer, you need Pap tests and screening for cancer for at least 20 years after your treatment. If Pap tests have been discontinued for you, your risk factors (such as having a new sexual partner) need to be reassessed to determine if you should start having screenings again. Some women have medical problems that increase the chance of getting cervical cancer. In these cases, your health care provider may recommend that you have screening and Pap tests more often.  If you have a family history of uterine cancer or ovarian cancer, talk with your health care provider about genetic screening.  If you have vaginal bleeding after reaching menopause, tell your health care provider.  There are currently no reliable tests available to screen for ovarian cancer.  Lung Cancer Lung cancer screening is recommended for adults 69-43 years old who are at high risk for lung cancer because of a history of smoking. A yearly low-dose CT scan of the lungs is recommended if you:  Currently smoke.  Have a history of at least  30 pack-years of smoking and you currently smoke or have quit within the past 15 years. A pack-year is smoking an average of one pack of cigarettes per day for one year.  Yearly screening should:  Continue until it has been 15 years since you quit.  Stop if you develop a health problem that would prevent you from having lung cancer treatment.  Colorectal Cancer  This type of cancer can be detected and can often be prevented.  Routine colorectal cancer screening usually begins at age 54 and continues through age 22.  If you have risk factors for colon cancer, your health care provider may recommend that you be screened  at an earlier age.  If you have a family history of colorectal cancer, talk with your health care provider about genetic screening.  Your health care provider may also recommend using home test kits to check for hidden blood in your stool.  A small camera at the end of a tube can be used to examine your colon directly (sigmoidoscopy or colonoscopy). This is done to check for the earliest forms of colorectal cancer.  Direct examination of the colon should be repeated every 5-10 years until age 53. However, if early forms of precancerous polyps or small growths are found or if you have a family history or genetic risk for colorectal cancer, you may need to be screened more often.  Skin Cancer  Check your skin from head to toe regularly.  Monitor any moles. Be sure to tell your health care provider: ? About any new moles or changes in moles, especially if there is a change in a mole's shape or color. ? If you have a mole that is larger than the size of a pencil eraser.  If any of your family members has a history of skin cancer, especially at a young age, talk with your health care provider about genetic screening.  Always use sunscreen. Apply sunscreen liberally and repeatedly throughout the day.  Whenever you are outside, protect yourself by wearing long sleeves, pants, a wide-brimmed hat, and sunglasses.  What should I know about osteoporosis? Osteoporosis is a condition in which bone destruction happens more quickly than new bone creation. After menopause, you may be at an increased risk for osteoporosis. To help prevent osteoporosis or the bone fractures that can happen because of osteoporosis, the following is recommended:  If you are 56-60 years old, get at least 1,000 mg of calcium and at least 600 mg of vitamin D per day.  If you are older than age 55 but younger than age 75, get at least 1,200 mg of calcium and at least 600 mg of vitamin D per day.  If you are older than age 45,  get at least 1,200 mg of calcium and at least 800 mg of vitamin D per day.  Smoking and excessive alcohol intake increase the risk of osteoporosis. Eat foods that are rich in calcium and vitamin D, and do weight-bearing exercises several times each week as directed by your health care provider. What should I know about how menopause affects my mental health? Depression may occur at any age, but it is more common as you become older. Common symptoms of depression include:  Low or sad mood.  Changes in sleep patterns.  Changes in appetite or eating patterns.  Feeling an overall lack of motivation or enjoyment of activities that you previously enjoyed.  Frequent crying spells.  Talk with your health care provider if you think  that you are experiencing depression. What should I know about immunizations? It is important that you get and maintain your immunizations. These include:  Tetanus, diphtheria, and pertussis (Tdap) booster vaccine.  Influenza every year before the flu season begins.  Pneumonia vaccine.  Shingles vaccine.  Your health care provider may also recommend other immunizations. This information is not intended to replace advice given to you by your health care provider. Make sure you discuss any questions you have with your health care provider. Document Released: 08/21/2005 Document Revised: 01/17/2016 Document Reviewed: 04/02/2015 Elsevier Interactive Patient Education  2018 [employer].

## 2017-01-14 ENCOUNTER — Encounter: Payer: Self-pay | Admitting: *Deleted

## 2017-07-20 ENCOUNTER — Encounter: Payer: Self-pay | Admitting: Family Medicine

## 2017-07-20 ENCOUNTER — Ambulatory Visit: Payer: 59 | Admitting: Family Medicine

## 2017-07-20 VITALS — BP 102/72 | HR 78 | Temp 98.6°F | Ht 61.25 in | Wt 141.4 lb

## 2017-07-20 DIAGNOSIS — M549 Dorsalgia, unspecified: Secondary | ICD-10-CM

## 2017-07-20 DIAGNOSIS — M79602 Pain in left arm: Secondary | ICD-10-CM

## 2017-07-20 MED ORDER — CYCLOBENZAPRINE HCL 5 MG PO TABS: 5.0000 mg | ORAL_TABLET | Freq: Every evening | ORAL | 0 refills | Status: DC | PRN

## 2017-07-20 NOTE — Patient Instructions (Signed)
BEFORE YOU LEAVE: -upper back exercises -follow up: 3-4 weeks  Heat, topical menthol (tiger balm is a good option) and Aleve per instructions as needed for pain.  Do the exercises about 4 days/week.  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  Muscle relaxer at night if needed.  Follow-up sooner if any worsening of symptoms.  If not improving we may need to do some imaging but also consider some other treatments.

## 2017-07-20 NOTE — Progress Notes (Signed)
HPI:  Catherine Conway is a pleasant 57 year old here for an acute visit for left shoulder pain.  This started about 3-4 weeks ago after she started to be cannot.  She does a lot of push-ups and planks program.  Pain is dull, mild and is in left upper back near the shoulder blade, she also has some soreness in the triceps at times wrist thumb.  Like Aleve helps the pain.  She has a slight sleeper.  Denies fevers, malaise, weakness, numbness or weight loss.  Denies any history of back or neck issues.  ROS: See pertinent positives and negatives per HPI.  History reviewed. No pertinent past medical history.  History reviewed. No pertinent surgical history.  Family History  Problem Relation Age of Onset  . Colon cancer Mother 1971  . Esophageal cancer Neg Hx   . Stomach cancer Neg Hx   . Rectal cancer Neg Hx     Social History   Socioeconomic History  . Marital status: Single    Spouse name: None  . Number of children: None  . Years of education: None  . Highest education level: None  Social Needs  . Financial resource strain: None  . Food insecurity - worry: None  . Food insecurity - inability: None  . Transportation needs - medical: None  . Transportation needs - non-medical: None  Occupational History  . None  Tobacco Use  . Smoking status: Never Smoker  . Smokeless tobacco: Never Used  Substance and Sexual Activity  . Alcohol use: No  . Drug use: No  . Sexual activity: None  Other Topics Concern  . None  Social History Narrative   Work or School: [employer]      Home Situation: lives with her sister      Spiritual Beliefs: Christian      Lifestyle: 5 days per week of exercise - working with trainer; eats healthy        Current Outpatient Medications:  .  Ascorbic Acid (VITAMIN C PO), Take by mouth daily., Disp: , Rfl:  .  Cholecalciferol (VITAMIN D PO), Take by mouth daily., Disp: , Rfl:  .  fluticasone (FLONASE) 50 MCG/ACT nasal spray, Place 2 sprays  into both nostrils daily., Disp: 16 g, Rfl: 6 .  Glucosamine HCl (GLUCOSAMINE PO), Take by mouth daily., Disp: , Rfl:  .  NON FORMULARY, Take by mouth daily. NutraLite Multivitamins., Disp: , Rfl:  .  NON FORMULARY, Fruit and vegetables 2 tablets daily, Disp: , Rfl:  .  NON FORMULARY, [place] essential health 1 tablet daily, Disp: , Rfl:  .  cyclobenzaprine (FLEXERIL) 5 MG tablet, Take 1 tablet (5 mg total) by mouth at bedtime as needed for muscle spasms., Disp: 20 tablet, Rfl: 0  EXAM:  Vitals:   07/20/17 1302  BP: 102/72  Pulse: 78  Temp: 98.6 F (37 C)    Body mass index is 26.5 kg/m.  GENERAL: vitals reviewed and listed above, alert, oriented, appears well hydrated and in no acute distress  NECK: no obvious masses on inspection  MS: moves all extremities without noticeable abnormality, normal appearance of the head neck and back, tenderness to palpation in the muscles along the thoracic region on the left 4 through 7, no bony tenderness to palpation, normal range of motion of the head and neck, no TTP on exam cervical spine region, normal strength, sensitivity to light touch and DTRs in the upper extremities bilaterally, normal cap refill in the digits bilaterally, negative Spurling  testing  PSYCH: pleasant and cooperative, no obvious depression or anxiety  ASSESSMENT AND PLAN:  Discussed the following assessment and plan:  Upper back pain  Pain of left upper extremity  -we discussed possible serious and likely etiologies, workup and treatment, treatment risks and return precautions -query muscle soreness from new activities versus mild radicular symptoms versus combination or other.  No neuro deficits on exam. -after this discussion, [name] opted for home exercise program, symptomatic care, muscle relaxer at night.  Declined formal physical therapy.  Discussed that she may need imaging and other improving. -follow up advised 3-4 weeks -of course, we advised [name]  to return or  notify a doctor immediately if symptoms worsen or persist or new concerns arise.   Patient Instructions  BEFORE YOU LEAVE: -upper back exercises -follow up: 3-4 weeks  Heat, topical menthol (tiger balm is a good option) and Aleve per instructions as needed for pain.  Do the exercises about 4 days/week.  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  Muscle relaxer at night if needed.  Follow-up sooner if any worsening of symptoms.  If not improving we may need to do some imaging but also consider some other treatments.       Kriste Basque R., DO

## 2017-07-21 DIAGNOSIS — H04123 Dry eye syndrome of bilateral lacrimal glands: Secondary | ICD-10-CM | POA: Diagnosis not present

## 2017-08-09 ENCOUNTER — Other Ambulatory Visit (HOSPITAL_BASED_OUTPATIENT_CLINIC_OR_DEPARTMENT_OTHER): Payer: Self-pay | Admitting: Obstetrics and Gynecology

## 2017-08-09 DIAGNOSIS — Z1231 Encounter for screening mammogram for malignant neoplasm of breast: Secondary | ICD-10-CM

## 2017-08-18 ENCOUNTER — Ambulatory Visit (HOSPITAL_BASED_OUTPATIENT_CLINIC_OR_DEPARTMENT_OTHER)
Admission: RE | Admit: 2017-08-18 | Discharge: 2017-08-18 | Disposition: A | Discharge status: Home / Self Care | Payer: 59 | Source: Ambulatory Visit | Attending: Obstetrics and Gynecology | Admitting: Obstetrics and Gynecology

## 2017-08-18 DIAGNOSIS — Z1231 Encounter for screening mammogram for malignant neoplasm of breast: Secondary | ICD-10-CM | POA: Insufficient documentation

## 2017-11-01 DIAGNOSIS — Z01419 Encounter for gynecological examination (general) (routine) without abnormal findings: Secondary | ICD-10-CM | POA: Diagnosis not present

## 2017-11-01 DIAGNOSIS — Z124 Encounter for screening for malignant neoplasm of cervix: Secondary | ICD-10-CM | POA: Diagnosis not present

## 2017-11-01 DIAGNOSIS — N882 Stricture and stenosis of cervix uteri: Secondary | ICD-10-CM | POA: Diagnosis not present

## 2017-11-01 DIAGNOSIS — N951 Menopausal and female climacteric states: Secondary | ICD-10-CM | POA: Diagnosis not present

## 2018-02-03 NOTE — Progress Notes (Signed)
HPI:  Using dictation device. Unfortunately this device frequently misinterprets words/phrases.  Here for CPE:  -Concerns and/or follow up today:   None.  -Diet: variety of foods, balance and well rounded -Exercise:  regular exercise -Taking folic acid, vitamin D or calcium: Taking vitamin D -Diabetes and Dyslipidemia Screening: done 01/2017 -Vaccines: see vaccine section EPIC -pap history: last pap with [facility] [place] [facility] 10/2015 -FDLMP: see nursing notes -sexual activity: yes, female partner, no new partners -wants STI testing (Hep C if born [birth year]): no -FH breast, colon or ovarian ca: see FH Last mammogram: birads 1 08/2017 Last colon cancer screening: colonosocpy 04/2013 Breast Ca Risk Assessment: see family history and pt history DEXA (>/= 65): Not applicable  -Alcohol, Tobacco, drug use: see social history  Review of Systems - no fevers, unintentional weight loss, vision loss, hearing loss, chest pain, sob, hemoptysis, melena, hematochezia, hematuria, genital discharge, changing or concerning skin lesions, bleeding, bruising, loc, thoughts of self harm or SI  History reviewed. No pertinent past medical history.  History reviewed. No pertinent surgical history.  Family History  Problem Relation Age of Onset  . Colon cancer Mother 10  . Esophageal cancer Neg Hx   . Stomach cancer Neg Hx   . Rectal cancer Neg Hx     Social History   Socioeconomic History  . Marital status: Single    Spouse name: Not on file  . Number of children: Not on file  . Years of education: Not on file  . Highest education level: Not on file  Occupational History  . Not on file  Social Needs  . Financial resource strain: Not on file  . Food insecurity:    Worry: Not on file    Inability: Not on file  . Transportation needs:    Medical: Not on file    Non-medical: Not on file  Tobacco Use  . Smoking status: Never Smoker  . Smokeless tobacco: Never Used  Substance and Sexual  Activity  . Alcohol use: No  . Drug use: No  . Sexual activity: Not on file  Lifestyle  . Physical activity:    Days per week: Not on file    Minutes per session: Not on file  . Stress: Not on file  Relationships  . Social connections:    Talks on phone: Not on file    Gets together: Not on file    Attends religious service: Not on file    Active member of club or organization: Not on file    Attends meetings of clubs or organizations: Not on file    Relationship status: Not on file  Other Topics Concern  . Not on file  Social History Narrative   Work or School: Public house manager Situation: lives with her sister      Spiritual Beliefs: Christian      Lifestyle: 5 days per week of exercise - working with trainer; eats healthy        Current Outpatient Medications:  .  Ascorbic Acid (VITAMIN C PO), Take by mouth daily., Disp: , Rfl:  .  Cholecalciferol (VITAMIN D PO), Take by mouth daily., Disp: , Rfl:  .  fluticasone (FLONASE) 50 MCG/ACT nasal spray, Place 2 sprays into both nostrils daily., Disp: 16 g, Rfl: 6 .  Glucosamine HCl (GLUCOSAMINE PO), Take by mouth daily., Disp: , Rfl:  .  NON FORMULARY, Take by mouth daily. NutraLite Multivitamins., Disp: , Rfl:  .  NON FORMULARY,  Fruit and vegetables 2 tablets daily, Disp: , Rfl:  .  NON FORMULARY, [place] essential health 1 tablet daily, Disp: , Rfl:   EXAM:  Vitals:   02/07/18 0938  BP: 100/68  Pulse: 61  Temp: 98 F (36.7 C)    GENERAL: vitals reviewed and listed below, alert, oriented, appears well hydrated and in no acute distress  HEENT: head atraumatic, PERRLA, normal appearance of eyes, ears, nose and mouth. moist mucus membranes.  NECK: supple, no masses or lymphadenopathy  LUNGS: clear to auscultation bilaterally, no rales, rhonchi or wheeze  CV: HRRR, no peripheral edema or cyanosis, normal pedal pulses  ABDOMEN: bowel sounds normal, soft, non tender to palpation, no masses, no rebound or  guarding  GU/BREAST:  declined, does with GYN yearly per her report  SKIN: no rash or abnormal lesions  MS: normal gait, moves all extremities normally  NEURO: normal gait, speech and thought processing grossly intact, muscle tone grossly intact throughout  PSYCH: normal affect, pleasant and cooperative  ASSESSMENT AND PLAN:  Discussed the following assessment and plan:  PREVENTIVE EXAM: -Discussed and advised all [place] preventive services health task force level A and B recommendations for age, sex and risks. -Advised at least 150 minutes of exercise per week and a healthy diet. see summary in handout -labs, studies and vaccines per orders this encounter   Patient advised to return to clinic immediately if symptoms worsen or persist or new concerns.  Patient Instructions  BEFORE YOU LEAVE: -Labs -follow up: Yearly, and as needed  We have ordered labs or studies at this visit. It can take up to 1-2 weeks for results and processing. IF results require follow up or explanation, we will call you with instructions. Clinically stable results will be released to your [facility]. If you have not heard from [place] or cannot find your results in [facility] in 2 weeks please contact our office at [phone].  If you are not yet signed up for [facility], please consider signing up.   We recommend the following healthy lifestyle for LIFE: 1) Small portions. But, make sure to get regular (at least 3 per day), healthy meals and small healthy snacks if needed.  2) Eat a healthy clean diet.   TRY TO EAT: -at least 5-7 servings of low sugar, colorful, and nutrient rich vegetables per day (not corn, potatoes or bananas.) -berries are the best choice if you wish to eat fruit (only eat small amounts if trying to reduce weight)  -lean meets (fish, white meat of chicken or [place]) -vegan proteins for some meals - beans or tofu, whole grains, nuts and seeds -Replace bad fats with good fats - good fats include:  fish, nuts and seeds, canola oil, olive oil -small amounts of low fat or non fat dairy -small amounts of100 % whole grains - check the lables -drink plenty of water  AVOID: -SUGAR, sweets, anything with added sugar, corn syrup or sweeteners - must read labels as even foods advertised as "healthy" often are loaded with sugar -if you must have a sweetener, small amounts of stevia may be best -sweetened beverages and artificially sweetened beverages -simple starches (rice, bread, potatoes, pasta, chips, etc - small amounts of 100% whole grains are ok) -red meat, pork, butter -fried foods, fast food, processed food, excessive dairy, eggs and coconut.  3)Get at least 150 minutes of sweaty aerobic exercise per week.  4)Reduce stress - consider counseling, meditation and relaxation to balance other aspects of your life.  Preventive Care 40-64 Years, Female Preventive care refers to lifestyle choices and visits with your health care provider that can promote health and wellness. What does preventive care include?  A yearly physical exam. This is also called an annual well check.  Dental exams once or twice a year.  Routine eye exams. Ask your health care provider how often you should have your eyes checked.  Personal lifestyle choices, including: ? Daily care of your teeth and gums. ? Regular physical activity. ? Eating a healthy diet. ? Avoiding tobacco and drug use. ? Limiting alcohol use. ? Practicing safe sex. ? Taking vitamin and mineral supplements as recommended by your health care provider. What happens during an annual well check? The services and screenings done by your health care provider during your annual well check will depend on your age, overall health, lifestyle risk factors, and family history of disease. Counseling Your health care provider may ask you questions about your:  Alcohol use.  Tobacco use.  Drug use.  Emotional well-being.  Home and  relationship well-being.  Sexual activity.  Eating habits.  Work and work Statistician.  Method of birth control.  Menstrual cycle.  Pregnancy history.  Screening You may have the following tests or measurements:  Height, weight, and BMI.  Blood pressure.  Lipid and cholesterol levels. These may be checked every 5 years, or more frequently if you are over 15 years old.  Skin check.  Lung cancer screening. You may have this screening every year starting at age 41 if you have a 30-pack-year history of smoking and currently smoke or have quit within the past 15 years.  Fecal occult blood test (FOBT) of the stool. You may have this test every year starting at age 31.  Flexible sigmoidoscopy or colonoscopy. You may have a sigmoidoscopy every 5 years or a colonoscopy every 10 years starting at age 3.  Hepatitis C blood test.  Hepatitis B blood test.  Sexually transmitted disease (STD) testing.  Diabetes screening. This is done by checking your blood sugar (glucose) after you have not eaten for a while (fasting). You may have this done every 1-3 years.  Mammogram. This may be done every 1-2 years. Talk to your health care provider about when you should start having regular mammograms. This may depend on whether you have a family history of breast cancer.  BRCA-related cancer screening. This may be done if you have a family history of breast, ovarian, tubal, or peritoneal cancers.  Pelvic exam and Pap test. This may be done every 3 years starting at age 23. Starting at age 40, this may be done every 5 years if you have a Pap test in combination with an HPV test.  Bone density scan. This is done to screen for osteoporosis. You may have this scan if you are at high risk for osteoporosis.  Discuss your test results, treatment options, and if necessary, the need for more tests with your health care provider. Vaccines Your health care provider may recommend certain vaccines, such  as:  Influenza vaccine. This is recommended every year.  Tetanus, diphtheria, and acellular pertussis (Tdap, Td) vaccine. You may need a Td booster every 10 years.  Varicella vaccine. You may need this if you have not been vaccinated.  Zoster vaccine. You may need this after age 59.  Measles, mumps, and rubella (MMR) vaccine. You may need at least one dose of MMR if you were born in 1957 or later. You may also need a  second dose.  Pneumococcal 13-valent conjugate (PCV13) vaccine. You may need this if you have certain conditions and were not previously vaccinated.  Pneumococcal polysaccharide (PPSV23) vaccine. You may need one or two doses if you smoke cigarettes or if you have certain conditions.  Meningococcal vaccine. You may need this if you have certain conditions.  Hepatitis A vaccine. You may need this if you have certain conditions or if you travel or work in places where you may be exposed to hepatitis A.  Hepatitis B vaccine. You may need this if you have certain conditions or if you travel or work in places where you may be exposed to hepatitis B.  Haemophilus influenzae type b (Hib) vaccine. You may need this if you have certain conditions.  Talk to your health care provider about which screenings and vaccines you need and how often you need them. This information is not intended to replace advice given to you by your health care provider. Make sure you discuss any questions you have with your health care provider. Document Released: 07/26/2015 Document Revised: 03/18/2016 Document Reviewed: 04/30/2015 Elsevier Interactive Patient Education  2018 [employer].          No follow-ups on file.  Lucretia Kern, DO

## 2018-02-07 ENCOUNTER — Encounter: Payer: Self-pay | Admitting: Family Medicine

## 2018-02-07 ENCOUNTER — Ambulatory Visit (INDEPENDENT_AMBULATORY_CARE_PROVIDER_SITE_OTHER): Payer: 59 | Admitting: Family Medicine

## 2018-02-07 VITALS — BP 100/68 | HR 61 | Temp 98.0°F | Ht 61.0 in | Wt 142.8 lb

## 2018-02-07 DIAGNOSIS — Z0000 Encounter for general adult medical examination without abnormal findings: Secondary | ICD-10-CM | POA: Diagnosis not present

## 2018-02-07 LAB — LIPID PANEL
CHOLESTEROL: 199 mg/dL (ref 0–200)
HDL: 100.2 mg/dL (ref >39.00)
LDL Cholesterol: 86 mg/dL (ref 0–99)
NonHDL: 98.38
TRIGLYCERIDES: 64 mg/dL (ref 0.0–149.0)
Total CHOL/HDL Ratio: 2
VLDL: 12.8 mg/dL (ref 0.0–40.0)

## 2018-02-07 LAB — HEMOGLOBIN A1C: HEMOGLOBIN A1C: 6.2 % (ref 4.6–6.5)

## 2018-02-07 NOTE — Patient Instructions (Signed)
BEFORE YOU LEAVE: -Labs -follow up: Yearly, and as needed  We have ordered labs or studies at this visit. It can take up to 1-2 weeks for results and processing. IF results require follow up or explanation, we will call you with instructions. Clinically stable results will be released to your [facility]. If you have not heard from [place] or cannot find your results in [facility] in 2 weeks please contact our office at [phone].  If you are not yet signed up for [facility], please consider signing up.   We recommend the following healthy lifestyle for LIFE: 1) Small portions. But, make sure to get regular (at least 3 per day), healthy meals and small healthy snacks if needed.  2) Eat a healthy clean diet.   TRY TO EAT: -at least 5-7 servings of low sugar, colorful, and nutrient rich vegetables per day (not corn, potatoes or bananas.) -berries are the best choice if you wish to eat fruit (only eat small amounts if trying to reduce weight)  -lean meets (fish, white meat of chicken or [place]) -vegan proteins for some meals - beans or tofu, whole grains, nuts and seeds -Replace bad fats with good fats - good fats include: fish, nuts and seeds, canola oil, olive oil -small amounts of low fat or non fat dairy -small amounts of100 % whole grains - check the lables -drink plenty of water  AVOID: -SUGAR, sweets, anything with added sugar, corn syrup or sweeteners - must read labels as even foods advertised as "healthy" often are loaded with sugar -if you must have a sweetener, small amounts of stevia may be best -sweetened beverages and artificially sweetened beverages -simple starches (rice, bread, potatoes, pasta, chips, etc - small amounts of 100% whole grains are ok) -red meat, pork, butter -fried foods, fast food, processed food, excessive dairy, eggs and coconut.  3)Get at least 150 minutes of sweaty aerobic exercise per week.  4)Reduce stress - consider counseling, meditation and relaxation to  balance other aspects of your life.   Preventive Care 40-64 Years, Female Preventive care refers to lifestyle choices and visits with your health care provider that can promote health and wellness. What does preventive care include?  A yearly physical exam. This is also called an annual well check.  Dental exams once or twice a year.  Routine eye exams. Ask your health care provider how often you should have your eyes checked.  Personal lifestyle choices, including: ? Daily care of your teeth and gums. ? Regular physical activity. ? Eating a healthy diet. ? Avoiding tobacco and drug use. ? Limiting alcohol use. ? Practicing safe sex. ? Taking vitamin and mineral supplements as recommended by your health care provider. What happens during an annual well check? The services and screenings done by your health care provider during your annual well check will depend on your age, overall health, lifestyle risk factors, and family history of disease. Counseling Your health care provider may ask you questions about your:  Alcohol use.  Tobacco use.  Drug use.  Emotional well-being.  Home and relationship well-being.  Sexual activity.  Eating habits.  Work and work Statistician.  Method of birth control.  Menstrual cycle.  Pregnancy history.  Screening You may have the following tests or measurements:  Height, weight, and BMI.  Blood pressure.  Lipid and cholesterol levels. These may be checked every 5 years, or more frequently if you are over 20 years old.  Skin check.  Lung cancer screening. You may have  this screening every year starting at age 4 if you have a 30-pack-year history of smoking and currently smoke or have quit within the past 15 years.  Fecal occult blood test (FOBT) of the stool. You may have this test every year starting at age 46.  Flexible sigmoidoscopy or colonoscopy. You may have a sigmoidoscopy every 5 years or a colonoscopy every 10 years  starting at age 77.  Hepatitis C blood test.  Hepatitis B blood test.  Sexually transmitted disease (STD) testing.  Diabetes screening. This is done by checking your blood sugar (glucose) after you have not eaten for a while (fasting). You may have this done every 1-3 years.  Mammogram. This may be done every 1-2 years. Talk to your health care provider about when you should start having regular mammograms. This may depend on whether you have a family history of breast cancer.  BRCA-related cancer screening. This may be done if you have a family history of breast, ovarian, tubal, or peritoneal cancers.  Pelvic exam and Pap test. This may be done every 3 years starting at age 2. Starting at age 67, this may be done every 5 years if you have a Pap test in combination with an HPV test.  Bone density scan. This is done to screen for osteoporosis. You may have this scan if you are at high risk for osteoporosis.  Discuss your test results, treatment options, and if necessary, the need for more tests with your health care provider. Vaccines Your health care provider may recommend certain vaccines, such as:  Influenza vaccine. This is recommended every year.  Tetanus, diphtheria, and acellular pertussis (Tdap, Td) vaccine. You may need a Td booster every 10 years.  Varicella vaccine. You may need this if you have not been vaccinated.  Zoster vaccine. You may need this after age 32.  Measles, mumps, and rubella (MMR) vaccine. You may need at least one dose of MMR if you were born in 1957 or later. You may also need a second dose.  Pneumococcal 13-valent conjugate (PCV13) vaccine. You may need this if you have certain conditions and were not previously vaccinated.  Pneumococcal polysaccharide (PPSV23) vaccine. You may need one or two doses if you smoke cigarettes or if you have certain conditions.  Meningococcal vaccine. You may need this if you have certain conditions.  Hepatitis A  vaccine. You may need this if you have certain conditions or if you travel or work in places where you may be exposed to hepatitis A.  Hepatitis B vaccine. You may need this if you have certain conditions or if you travel or work in places where you may be exposed to hepatitis B.  Haemophilus influenzae type b (Hib) vaccine. You may need this if you have certain conditions.  Talk to your health care provider about which screenings and vaccines you need and how often you need them. This information is not intended to replace advice given to you by your health care provider. Make sure you discuss any questions you have with your health care provider. Document Released: 07/26/2015 Document Revised: 03/18/2016 Document Reviewed: 04/30/2015 Elsevier Interactive Patient Education  [employer].

## 2018-02-14 ENCOUNTER — Telehealth: Payer: Self-pay | Admitting: Family Medicine

## 2018-02-14 NOTE — Telephone Encounter (Signed)
Pt given results and recommendations per notes of Dr. Selena BattenKim on 02/08/18. Pt verbalized understanding.Unable to document in result note.

## 2018-02-15 NOTE — Telephone Encounter (Signed)
Noted-see results note. 

## 2018-03-03 ENCOUNTER — Telehealth: Payer: Self-pay | Admitting: *Deleted

## 2018-03-03 NOTE — Telephone Encounter (Signed)
Results mailed to the pts home address. Narda AmberJo Anne,CMA     Copied from CRM [phone]. Topic: General - Other >> Mar 01, 2018  5:30 PM Marylen PontoMcneil, Ja-Kwan wrote: Reason for CRM: Pt requests that a copy of her most recent labs results be mailed to her. Pt verified mailing address [address] [place] [phone]

## 2018-04-18 IMAGING — MG DIGITAL SCREENING BILATERAL MAMMOGRAM WITH TOMO WITH CAD
8 series · 9 of 24 positions shown · non-contrast
Comparison: Previous exam(s).

CLINICAL DATA: Screening.

EXAM:
DIGITAL SCREENING BILATERAL MAMMOGRAM WITH TOMO WITH CAD

[R MLO]
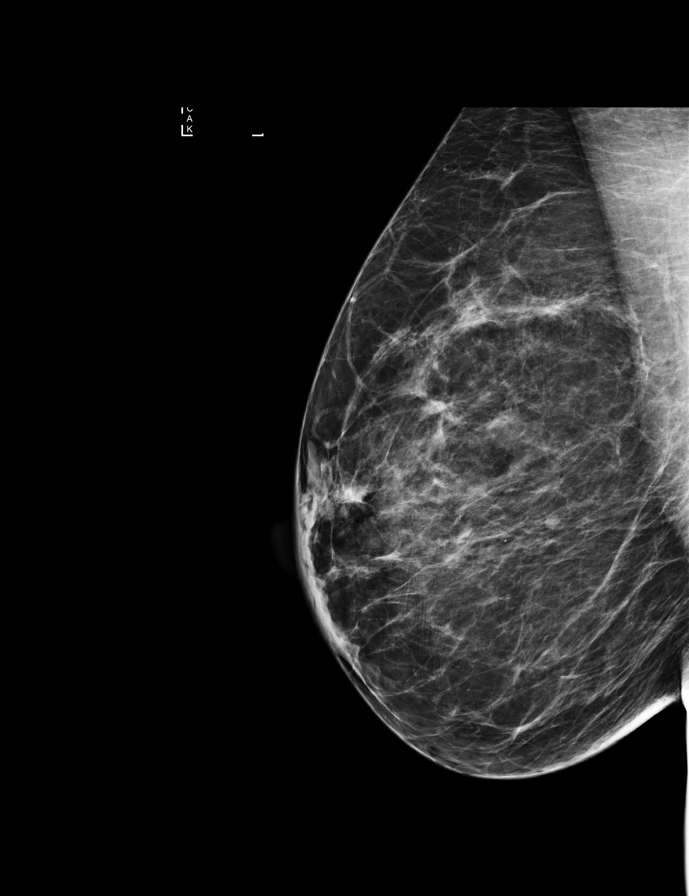

[L MLO]
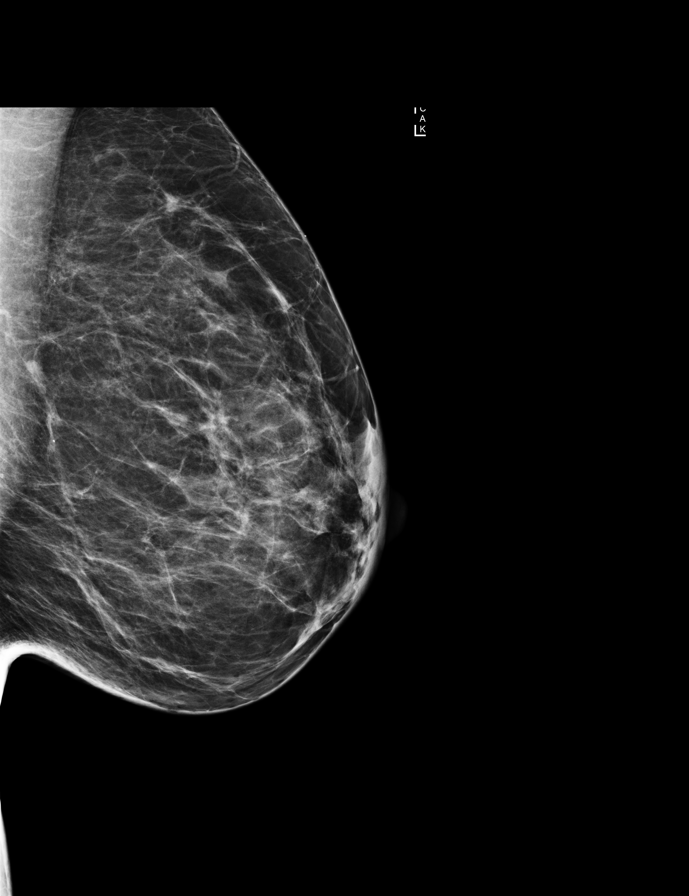

[L CC]
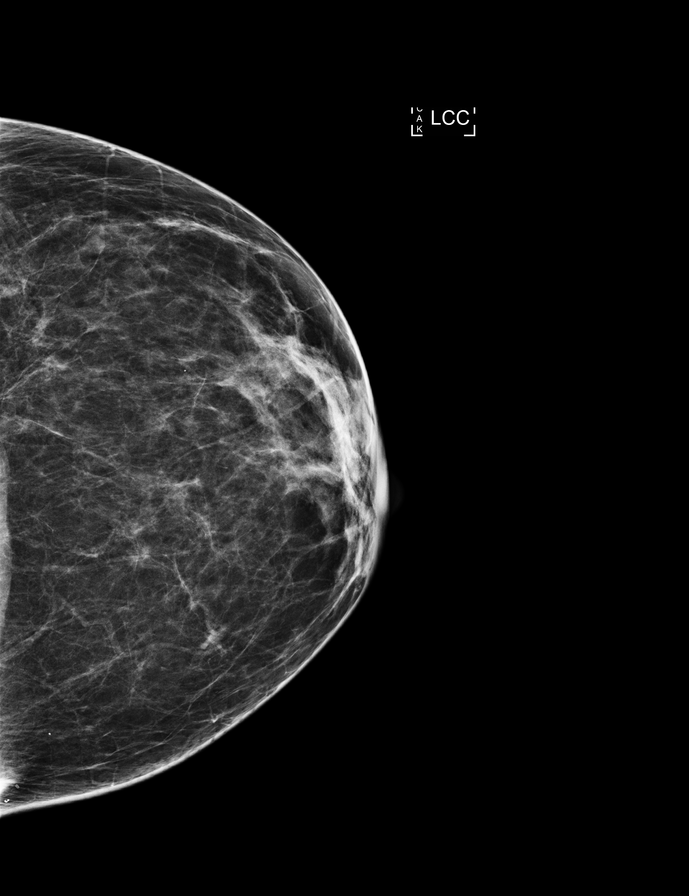

[R CC]
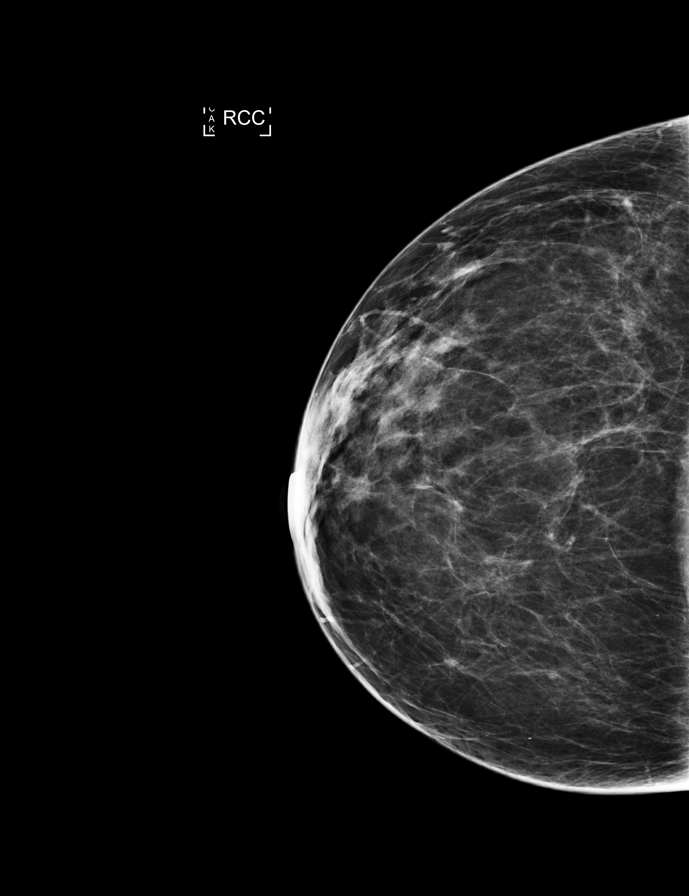

[L MLO tomo · 2 of 76 frames shown]
[frame 25/76]
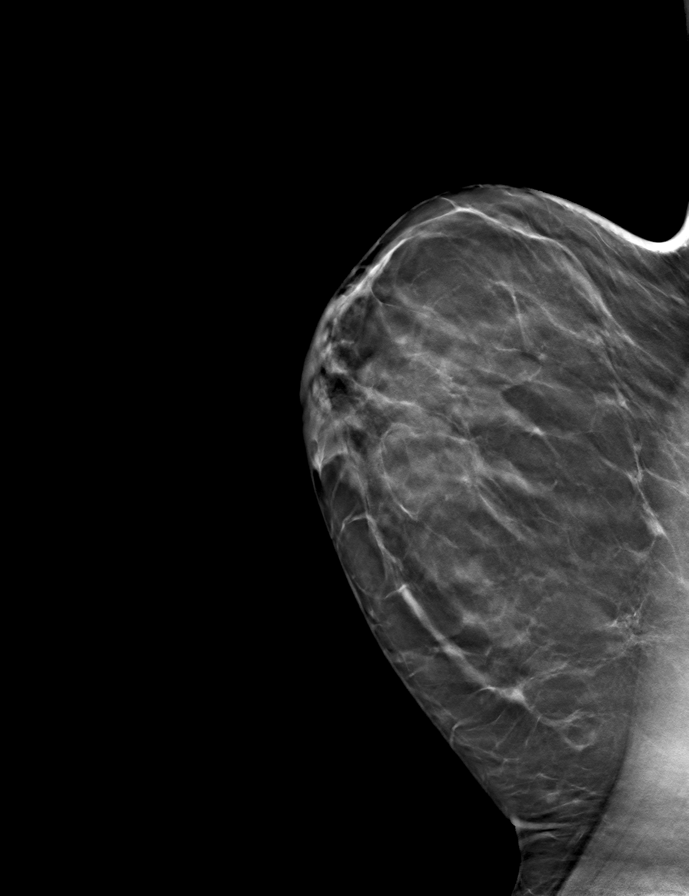
[frame 39/76]
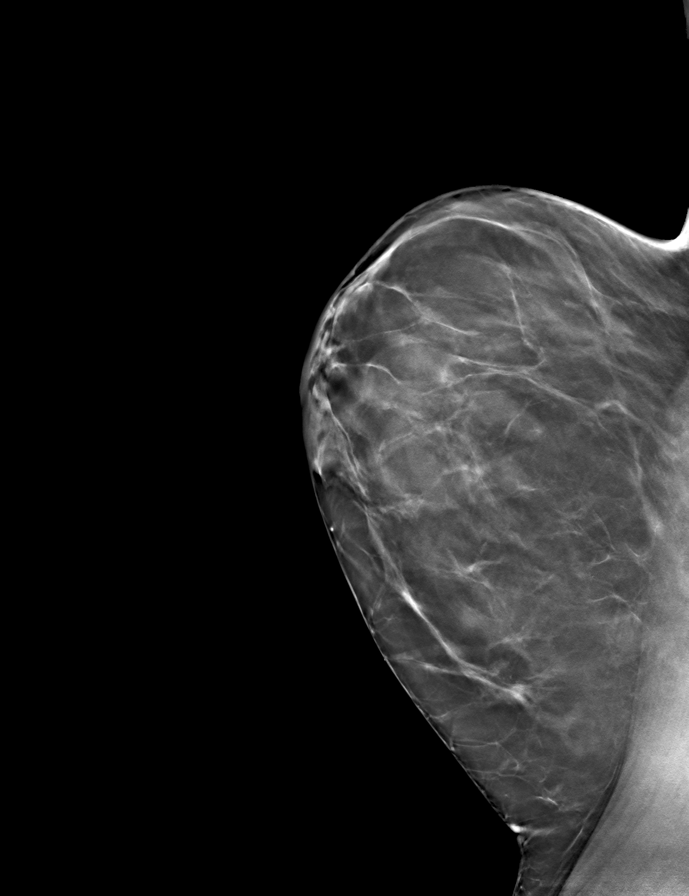

[L CC tomo · tomo slice 32/63.0]
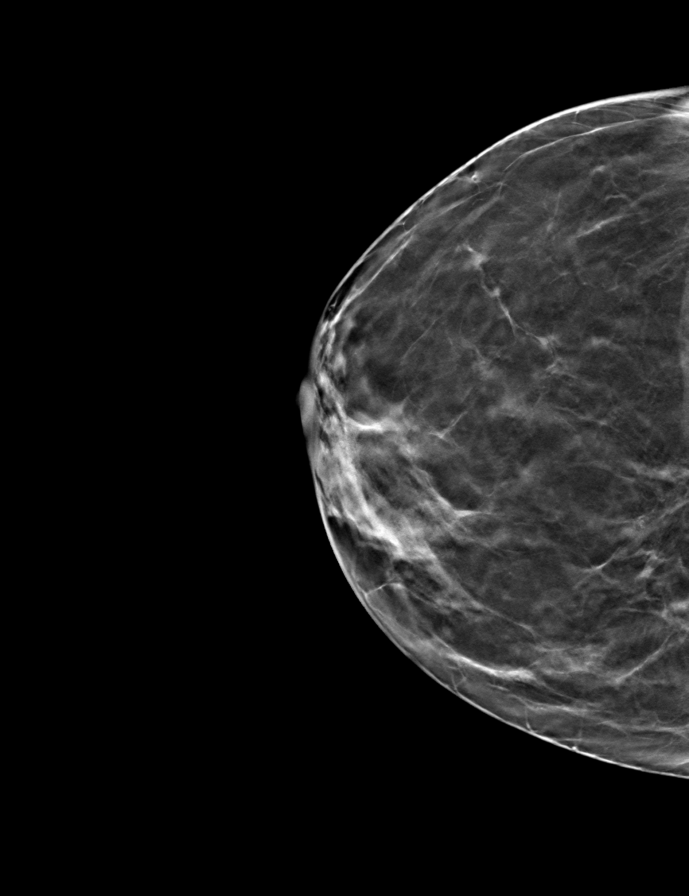

[R MLO tomo · tomo slice 39/78.0]
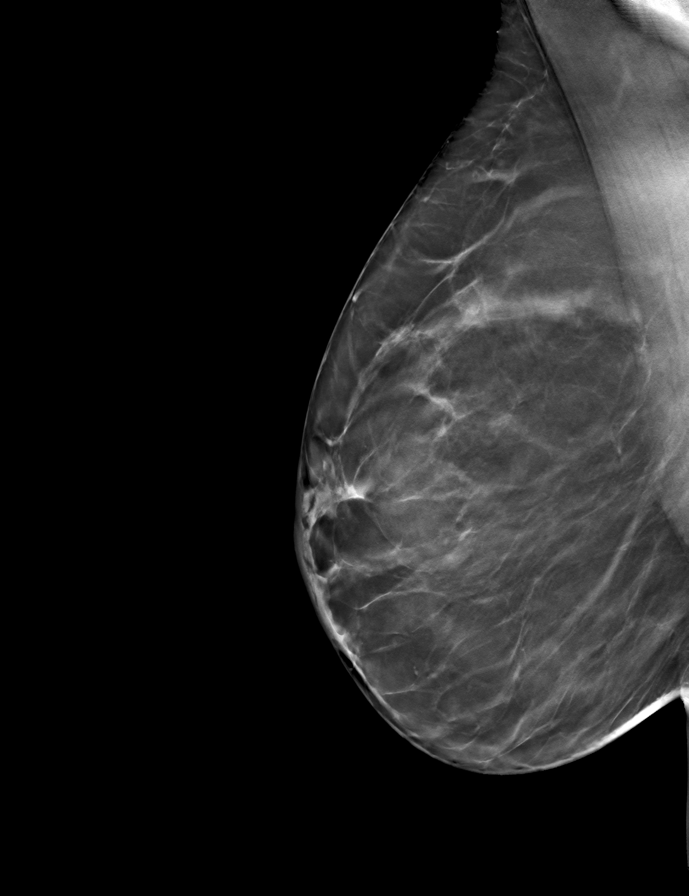

[R CC tomo · tomo slice 37/74.0]
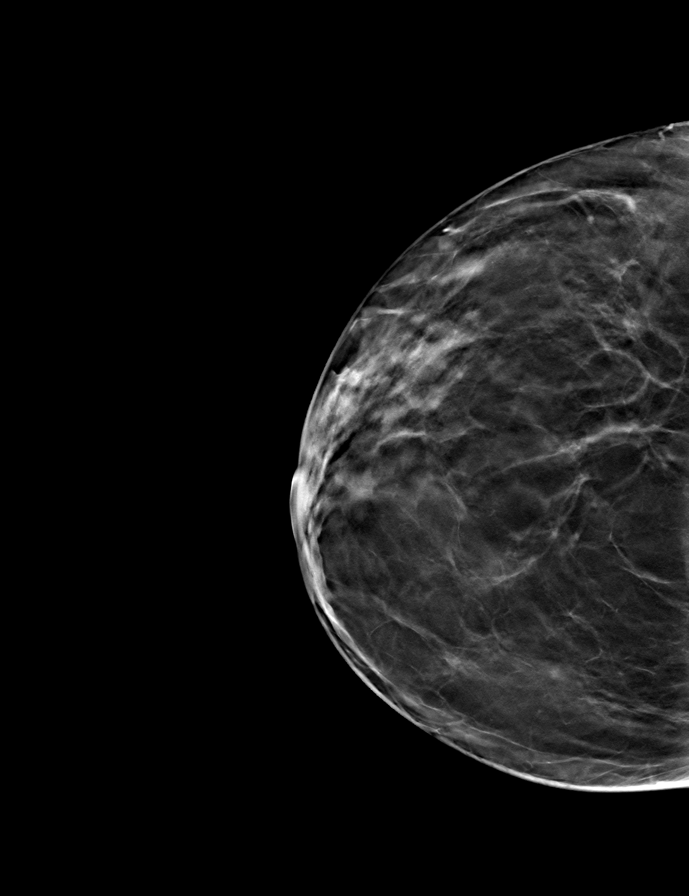

[9 of 24 positions shown; findings below may reference images not displayed]

ACR Breast Density Category b: There are scattered areas of
fibroglandular density.
FINDINGS: There are no findings suspicious for malignancy. Images were
processed with CAD.
IMPRESSION: No mammographic evidence of malignancy. A result letter of this
screening mammogram will be mailed directly to the patient.

RECOMMENDATION:
Screening mammogram in one year. (Code:[id number])

BI-RADS CATEGORY  1: Negative.

## 2018-05-09 DIAGNOSIS — M791 Myalgia, unspecified site: Secondary | ICD-10-CM | POA: Diagnosis not present

## 2018-05-09 DIAGNOSIS — M542 Cervicalgia: Secondary | ICD-10-CM | POA: Diagnosis not present

## 2018-05-09 DIAGNOSIS — M50222 Other cervical disc displacement at C5-C6 level: Secondary | ICD-10-CM | POA: Diagnosis not present

## 2018-07-26 DIAGNOSIS — H11153 Pinguecula, bilateral: Secondary | ICD-10-CM | POA: Diagnosis not present

## 2018-07-27 DIAGNOSIS — M50322 Other cervical disc degeneration at C5-C6 level: Secondary | ICD-10-CM | POA: Diagnosis not present

## 2018-07-27 DIAGNOSIS — M791 Myalgia, unspecified site: Secondary | ICD-10-CM | POA: Diagnosis not present

## 2018-07-27 DIAGNOSIS — M50222 Other cervical disc displacement at C5-C6 level: Secondary | ICD-10-CM | POA: Diagnosis not present

## 2018-07-27 DIAGNOSIS — M542 Cervicalgia: Secondary | ICD-10-CM | POA: Diagnosis not present

## 2018-08-02 ENCOUNTER — Other Ambulatory Visit (HOSPITAL_BASED_OUTPATIENT_CLINIC_OR_DEPARTMENT_OTHER): Payer: Self-pay | Admitting: Obstetrics and Gynecology

## 2018-08-02 DIAGNOSIS — Z1231 Encounter for screening mammogram for malignant neoplasm of breast: Secondary | ICD-10-CM

## 2018-08-03 DIAGNOSIS — M50222 Other cervical disc displacement at C5-C6 level: Secondary | ICD-10-CM | POA: Diagnosis not present

## 2018-08-03 DIAGNOSIS — M542 Cervicalgia: Secondary | ICD-10-CM | POA: Diagnosis not present

## 2018-08-03 DIAGNOSIS — M50322 Other cervical disc degeneration at C5-C6 level: Secondary | ICD-10-CM | POA: Diagnosis not present

## 2018-08-03 DIAGNOSIS — M791 Myalgia, unspecified site: Secondary | ICD-10-CM | POA: Diagnosis not present

## 2018-08-10 DIAGNOSIS — M50222 Other cervical disc displacement at C5-C6 level: Secondary | ICD-10-CM | POA: Diagnosis not present

## 2018-08-10 DIAGNOSIS — M542 Cervicalgia: Secondary | ICD-10-CM | POA: Diagnosis not present

## 2018-08-10 DIAGNOSIS — M50322 Other cervical disc degeneration at C5-C6 level: Secondary | ICD-10-CM | POA: Diagnosis not present

## 2018-08-17 DIAGNOSIS — M50322 Other cervical disc degeneration at C5-C6 level: Secondary | ICD-10-CM | POA: Diagnosis not present

## 2018-08-17 DIAGNOSIS — M542 Cervicalgia: Secondary | ICD-10-CM | POA: Diagnosis not present

## 2018-08-17 DIAGNOSIS — M791 Myalgia, unspecified site: Secondary | ICD-10-CM | POA: Diagnosis not present

## 2018-08-17 DIAGNOSIS — M50222 Other cervical disc displacement at C5-C6 level: Secondary | ICD-10-CM | POA: Diagnosis not present

## 2018-08-23 ENCOUNTER — Ambulatory Visit (HOSPITAL_BASED_OUTPATIENT_CLINIC_OR_DEPARTMENT_OTHER)
Admission: RE | Admit: 2018-08-23 | Discharge: 2018-08-23 | Disposition: A | Discharge status: Home / Self Care | Payer: 59 | Source: Ambulatory Visit | Attending: Obstetrics and Gynecology | Admitting: Obstetrics and Gynecology

## 2018-08-23 DIAGNOSIS — Z1231 Encounter for screening mammogram for malignant neoplasm of breast: Secondary | ICD-10-CM | POA: Diagnosis not present

## 2018-08-24 DIAGNOSIS — M50222 Other cervical disc displacement at C5-C6 level: Secondary | ICD-10-CM | POA: Diagnosis not present

## 2018-08-24 DIAGNOSIS — M542 Cervicalgia: Secondary | ICD-10-CM | POA: Diagnosis not present

## 2018-08-24 DIAGNOSIS — M50322 Other cervical disc degeneration at C5-C6 level: Secondary | ICD-10-CM | POA: Diagnosis not present

## 2018-08-31 DIAGNOSIS — M542 Cervicalgia: Secondary | ICD-10-CM | POA: Diagnosis not present

## 2018-08-31 DIAGNOSIS — M791 Myalgia, unspecified site: Secondary | ICD-10-CM | POA: Diagnosis not present

## 2018-08-31 DIAGNOSIS — M50322 Other cervical disc degeneration at C5-C6 level: Secondary | ICD-10-CM | POA: Diagnosis not present

## 2018-08-31 DIAGNOSIS — M50222 Other cervical disc displacement at C5-C6 level: Secondary | ICD-10-CM | POA: Diagnosis not present

## 2018-09-07 DIAGNOSIS — M50222 Other cervical disc displacement at C5-C6 level: Secondary | ICD-10-CM | POA: Diagnosis not present

## 2018-09-07 DIAGNOSIS — M50322 Other cervical disc degeneration at C5-C6 level: Secondary | ICD-10-CM | POA: Diagnosis not present

## 2018-09-07 DIAGNOSIS — M542 Cervicalgia: Secondary | ICD-10-CM | POA: Diagnosis not present

## 2018-09-14 DIAGNOSIS — M50322 Other cervical disc degeneration at C5-C6 level: Secondary | ICD-10-CM | POA: Diagnosis not present

## 2018-09-14 DIAGNOSIS — M50222 Other cervical disc displacement at C5-C6 level: Secondary | ICD-10-CM | POA: Diagnosis not present

## 2018-09-14 DIAGNOSIS — M542 Cervicalgia: Secondary | ICD-10-CM | POA: Diagnosis not present

## 2018-09-22 DIAGNOSIS — M791 Myalgia, unspecified site: Secondary | ICD-10-CM | POA: Diagnosis not present

## 2018-09-22 DIAGNOSIS — M542 Cervicalgia: Secondary | ICD-10-CM | POA: Diagnosis not present

## 2018-09-22 DIAGNOSIS — M50222 Other cervical disc displacement at C5-C6 level: Secondary | ICD-10-CM | POA: Diagnosis not present

## 2018-09-22 DIAGNOSIS — M50322 Other cervical disc degeneration at C5-C6 level: Secondary | ICD-10-CM | POA: Diagnosis not present

## 2018-09-28 DIAGNOSIS — M542 Cervicalgia: Secondary | ICD-10-CM | POA: Diagnosis not present

## 2018-09-28 DIAGNOSIS — M791 Myalgia, unspecified site: Secondary | ICD-10-CM | POA: Diagnosis not present

## 2018-09-28 DIAGNOSIS — M50222 Other cervical disc displacement at C5-C6 level: Secondary | ICD-10-CM | POA: Diagnosis not present

## 2018-09-28 DIAGNOSIS — M50322 Other cervical disc degeneration at C5-C6 level: Secondary | ICD-10-CM | POA: Diagnosis not present

## 2018-10-03 DIAGNOSIS — M542 Cervicalgia: Secondary | ICD-10-CM | POA: Diagnosis not present

## 2018-10-03 DIAGNOSIS — M50322 Other cervical disc degeneration at C5-C6 level: Secondary | ICD-10-CM | POA: Diagnosis not present

## 2018-10-03 DIAGNOSIS — M50222 Other cervical disc displacement at C5-C6 level: Secondary | ICD-10-CM | POA: Diagnosis not present

## 2018-10-10 DIAGNOSIS — M542 Cervicalgia: Secondary | ICD-10-CM | POA: Diagnosis not present

## 2018-10-10 DIAGNOSIS — M50222 Other cervical disc displacement at C5-C6 level: Secondary | ICD-10-CM | POA: Diagnosis not present

## 2018-10-10 DIAGNOSIS — M50322 Other cervical disc degeneration at C5-C6 level: Secondary | ICD-10-CM | POA: Diagnosis not present

## 2018-10-10 DIAGNOSIS — M791 Myalgia, unspecified site: Secondary | ICD-10-CM | POA: Diagnosis not present

## 2018-10-17 ENCOUNTER — Encounter: Payer: 59 | Admitting: Family Medicine

## 2018-10-17 ENCOUNTER — Other Ambulatory Visit: Payer: Self-pay

## 2018-10-19 ENCOUNTER — Encounter: Payer: Self-pay | Admitting: Family Medicine

## 2018-10-19 ENCOUNTER — Other Ambulatory Visit: Payer: Self-pay

## 2018-10-19 ENCOUNTER — Ambulatory Visit (INDEPENDENT_AMBULATORY_CARE_PROVIDER_SITE_OTHER): Payer: 59 | Admitting: Family Medicine

## 2018-10-19 DIAGNOSIS — J302 Other seasonal allergic rhinitis: Secondary | ICD-10-CM | POA: Diagnosis not present

## 2018-10-19 DIAGNOSIS — M25512 Pain in left shoulder: Secondary | ICD-10-CM | POA: Diagnosis not present

## 2018-10-19 NOTE — Progress Notes (Signed)
Virtual Visit via Video Note Video did not work on webex, spoke with pt via webex.  I connected with Catherine Conway on 10/19/18 at 11:30 AM EDT by a video enabled telemedicine application and verified that I am speaking with the correct person using two identifiers.  Location patient: home Location provider:home office Persons participating in the virtual visit: patient, provider  I discussed the limitations of evaluation and management by telemedicine and the availability of in person appointments. The patient expressed understanding and agreed to proceed.   HPI: Pt is a 58 yo female with no sig pmh seen for f/u and TOC, previously seen by Dr. Selena Batten.  L pain in shoulder.  Given muscle relaxer by her pcp which did not help.  Went to a Statistician.  States pain caused by a muscle in her back being tight 2/2 the way she sits at work.  Working on posture, improved since working from home as not sitting in a chair with arms.  Started walking and running for exercise since gym is closed 2/2 COVID-19.  Drinking 8 eight oz glasses of water per day.    Taking claritin for seasonal allergies.  Typically does not have many problems with symptoms.  NKDA  ROS: See pertinent positives and negatives per HPI.  No past medical history on file.  No past surgical history on file.  Family History  Problem Relation Age of Onset  . Colon cancer Mother 22  . Esophageal cancer Neg Hx   . Stomach cancer Neg Hx   . 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   Current Outpatient Medications:  .  Ascorbic Acid (VITAMIN C PO), Take by mouth daily., Disp: , Rfl:  .  Cholecalciferol (VITAMIN D PO), Take by mouth daily., Disp: , Rfl:  .  fluticasone (FLONASE) 50 MCG/ACT nasal spray, Place 2 sprays into both nostrils daily., Disp: 16 g, Rfl: 6 .  Glucosamine HCl (GLUCOSAMINE PO), Take by mouth daily., Disp: , Rfl:  .  NON  FORMULARY, Take by mouth daily. NutraLite Multivitamins., Disp: , Rfl:  .  NON FORMULARY, Fruit and vegetables 2 tablets daily, Disp: , Rfl:  .  NON FORMULARY, [employer] essential health 1 tablet daily, Disp: , Rfl:   EXAM:  VITALS per patient if applicable:  RR between 12-20 bpm  GENERAL: sounds pleasant, alert, oriented, and in no acute distress  LUNGS: breathing rate sounds normal, no obvious gross SOB, gasping or wheezing  PSYCH/NEURO: pleasant and cooperative, no obvious depression or anxiety, speech and thought processing grossly intact  ASSESSMENT AND PLAN:  Discussed the following assessment and plan:  Left shoulder pain, unspecified chronicity  -improving -discussed continuing stretching/exercises. -consider ergonomic adjustments to work space. -NSAIDs prn. -f/u prn  Seasonal allergies -continue Claritin  I discussed the assessment and treatment plan with the patient. The patient was provided an opportunity to ask questions and all were answered. The patient agreed with the plan and demonstrated an understanding of the instructions.   The patient was advised to call back or seek an in-person evaluation if the symptoms worsen or if the condition fails to improve as anticipated.  Deeann Saint, MD

## 2019-07-25 ENCOUNTER — Other Ambulatory Visit (HOSPITAL_BASED_OUTPATIENT_CLINIC_OR_DEPARTMENT_OTHER): Payer: Self-pay | Admitting: Obstetrics and Gynecology

## 2019-07-25 DIAGNOSIS — Z1231 Encounter for screening mammogram for malignant neoplasm of breast: Secondary | ICD-10-CM

## 2019-08-25 ENCOUNTER — Ambulatory Visit (HOSPITAL_BASED_OUTPATIENT_CLINIC_OR_DEPARTMENT_OTHER)
Admission: RE | Admit: 2019-08-25 | Discharge: 2019-08-25 | Disposition: A | Discharge status: Home / Self Care | Payer: 59 | Source: Ambulatory Visit | Attending: Obstetrics and Gynecology | Admitting: Obstetrics and Gynecology

## 2019-08-25 ENCOUNTER — Other Ambulatory Visit: Payer: Self-pay

## 2019-08-25 DIAGNOSIS — Z1231 Encounter for screening mammogram for malignant neoplasm of breast: Secondary | ICD-10-CM | POA: Diagnosis present

## 2019-10-04 ENCOUNTER — Other Ambulatory Visit: Payer: Self-pay

## 2019-10-05 ENCOUNTER — Ambulatory Visit (INDEPENDENT_AMBULATORY_CARE_PROVIDER_SITE_OTHER): Payer: 59 | Admitting: Family Medicine

## 2019-10-05 ENCOUNTER — Encounter: Payer: Self-pay | Admitting: Family Medicine

## 2019-10-05 VITALS — BP 108/78 | HR 72 | Temp 97.6°F | Wt 141.0 lb

## 2019-10-05 DIAGNOSIS — Z0000 Encounter for general adult medical examination without abnormal findings: Secondary | ICD-10-CM | POA: Diagnosis not present

## 2019-10-05 DIAGNOSIS — D709 Neutropenia, unspecified: Secondary | ICD-10-CM

## 2019-10-05 DIAGNOSIS — S46011A Strain of muscle(s) and tendon(s) of the rotator cuff of right shoulder, initial encounter: Secondary | ICD-10-CM

## 2019-10-05 DIAGNOSIS — Z13220 Encounter for screening for lipoid disorders: Secondary | ICD-10-CM

## 2019-10-05 DIAGNOSIS — Z131 Encounter for screening for diabetes mellitus: Secondary | ICD-10-CM | POA: Diagnosis not present

## 2019-10-05 LAB — CBC WITH DIFFERENTIAL/PLATELET
Basophils Absolute: 0 10*3/uL (ref 0.0–0.1)
Basophils Relative: 0.5 % (ref 0.0–3.0)
Eosinophils Absolute: 0 10*3/uL (ref 0.0–0.7)
Eosinophils Relative: 1.3 % (ref 0.0–5.0)
HCT: 41.1 % (ref 36.0–46.0)
Hemoglobin: 13.7 g/dL (ref 12.0–15.0)
Lymphocytes Relative: 53.5 % — ABNORMAL HIGH (ref 12.0–46.0)
Lymphs Abs: 1.5 10*3/uL (ref 0.7–4.0)
MCHC: 33.2 g/dL (ref 30.0–36.0)
MCV: 87 fl (ref 78.0–100.0)
Monocytes Absolute: 0.2 10*3/uL (ref 0.1–1.0)
Monocytes Relative: 6.1 % (ref 3.0–12.0)
Neutro Abs: 1.1 10*3/uL — ABNORMAL LOW (ref 1.4–7.7)
Neutrophils Relative %: 38.6 % — ABNORMAL LOW (ref 43.0–77.0)
Platelets: 304 10*3/uL (ref 150.0–400.0)
RBC: 4.73 Mil/uL (ref 3.87–5.11)
RDW: 14 % (ref 11.5–15.5)
WBC: 2.9 10*3/uL — ABNORMAL LOW (ref 4.0–10.5)

## 2019-10-05 LAB — BASIC METABOLIC PANEL
BUN: 19 mg/dL (ref 6–23)
CO2: 30 mEq/L (ref 19–32)
Calcium: 9.9 mg/dL (ref 8.4–10.5)
Chloride: 102 mEq/L (ref 96–112)
Creatinine, Ser: 0.74 mg/dL (ref 0.40–1.20)
GFR: 97.92 mL/min (ref >60.00)
Glucose, Bld: 91 mg/dL (ref 70–99)
Potassium: 4.4 mEq/L (ref 3.5–5.1)
Sodium: 140 mEq/L (ref 135–145)

## 2019-10-05 LAB — LIPID PANEL
Cholesterol: 237 mg/dL — ABNORMAL HIGH (ref 0–200)
HDL: 125.2 mg/dL (ref >39.00)
LDL Cholesterol: 99 mg/dL (ref 0–99)
NonHDL: 111.39
Total CHOL/HDL Ratio: 2
Triglycerides: 61 mg/dL (ref 0.0–149.0)
VLDL: 12.2 mg/dL (ref 0.0–40.0)

## 2019-10-05 LAB — HEMOGLOBIN A1C: Hgb A1c MFr Bld: 5.9 % (ref 4.6–6.5)

## 2019-10-05 NOTE — Progress Notes (Signed)
Subjective:     Catherine Conway is a 59 y.o. female and is here for a comprehensive physical exam. The patient reports problems - soreness of RUE.  Pt intermittent soreness/pulling sensation of posterior RUE when reaching overhead.  Pt does not recall injury.  Sleeps on her R side.  Arm not currenlty hurting.  Has not had to take anything for the discomfort.  Colonoscopy and mammogram up date.  Pap schedule for July 2021 with OB/Gyn.  Social History   Socioeconomic History  . Marital status: Single    Spouse name: Not on file  . Number of children: Not on file  . Years of education: Not on file  . Highest education level: Not on file  Occupational History  . Not on file  Tobacco Use  . Smoking status: Never Smoker  . Smokeless tobacco: Never Used  Substance and Sexual Activity  . Alcohol use: No  . Drug use: No  . Sexual activity: Not on file  Other Topics Concern  . Not on file  Social History Narrative   Work or School: Public house manager Situation: lives with her sister      Spiritual Beliefs: Christian      Lifestyle: 5 days per week of exercise - working with Clinical research associate; eats healthy      Social Determinants of Health   Financial Resource Strain:   . Difficulty of Paying Living Expenses:   Food Insecurity:   . Worried About Charity fundraiser in the Last Year:   . Arboriculturist in the Last Year:   Transportation Needs:   . Film/video editor (Medical):   Marland Kitchen Lack of Transportation (Non-Medical):   Physical Activity:   . Days of Exercise per Week:   . Minutes of Exercise per Session:   Stress:   . Feeling of Stress :   Social Connections:   . Frequency of Communication with Friends and Family:   . Frequency of Social Gatherings with Friends and Family:   . Attends Religious Services:   . Active Member of Clubs or Organizations:   . Attends Archivist Meetings:   Marland Kitchen Marital Status:   Intimate Partner Violence:   . Fear of Current or  Ex-Partner:   . Emotionally Abused:   Marland Kitchen Physically Abused:   . Sexually Abused:    Health Maintenance  Topic Date Due  . INFLUENZA VACCINE  Never done  . PAP SMEAR-Modifier  10/30/2019  . HIV Screening  12/27/2024 (Originally 11/15/1975)  . MAMMOGRAM  08/24/2021  . TETANUS/TDAP  11/10/2021  . COLONOSCOPY  04/15/2023  . Hepatitis C Screening  Completed    The following portions of the patient's history were reviewed and updated as appropriate: allergies, current medications, past family history, past medical history, past social history, past surgical history and problem list.  Review of Systems Pertinent items noted in HPI and remainder of comprehensive ROS otherwise negative.   Objective:    BP 108/78 (BP Location: Left Arm, Patient Position: Sitting, Cuff Size: Normal)   Pulse 72   Temp 97.6 F (36.4 C) (Temporal)   Wt 141 lb (64 kg)   LMP 03/17/2013   SpO2 98%   BMI 26.64 kg/m  General appearance: alert, cooperative and no distress Head: Normocephalic, without obvious abnormality, atraumatic Eyes: conjunctivae/corneas clear. PERRL, EOM's intact. Fundi benign. Ears: normal TM's and external ear canals both ears Nose: Nares normal. Septum midline. Mucosa normal. No drainage or sinus  tenderness. Throat: lips, mucosa, and tongue normal; teeth and gums normal Neck: no adenopathy, no carotid bruit, no JVD, supple, symmetrical, trachea midline and thyroid not enlarged, symmetric, no tenderness/mass/nodules Lungs: clear to auscultation bilaterally Heart: regular rate and rhythm, S1, S2 normal, no murmur, click, rub or gallop Abdomen: soft, non-tender; bowel sounds normal; no masses,  no organomegaly Extremities: extremities normal, atraumatic, no cyanosis or edema  B/l shoulders symmetric, no deformities.  TTP of R teres minor.  Pulling sensation with Neer's.  Negative Hawkins, empty can, lift off tests. Pulses: 2+ and symmetric Skin: Skin color, texture, turgor normal. No rashes  or lesions Lymph nodes: Cervical, supraclavicular, and axillary nodes normal. Neurologic: Alert and oriented X 3, normal strength and tone. Normal symmetric reflexes. Normal coordination and gait    Assessment:    Healthy female exam.      Plan:     Anticipatory guidance given including wearing seatbelts, smoke detectors in the home, increasing physical activity, increasing p.o. intake of water and vegetables. -will obtain labs -colonoscopy done at age 12 normal.  Repeat due in 2024 -mammogram done 08/25/19.  Due next yr. -pap due in July with OB/Gyn, Henreitta Leber -given handout -next CPE in 1 yr See After Visit Summary for Counseling Recommendations    Strain of right rotator cuff capsule, initial encounter -teres minor strain -supportive care: heat, ice, NSAIDs, OTC analgesic rubs, stretching -given handouts -continue to monitor  Screening for cholesterol level  - Plan: Lipid panel  Screening for diabetes mellitus  - Plan: Hemoglobin A1c   Labs reviewed.  Pt called with results:  Neutropenia   -pt asymptomatic.  No recent illness.  Not   currently on meds that can cause.  Will inquire   about family hx.     -WBC 2.9, neutrophils # 1.1, and neutrophils 38.6   -h/o leukopenia per chart review   -will place referral to Heme/Onc for further eval   HLD   -Total cholesterol elevated at 237.   -lifestyle modifications encouraged.   -will continue to monitor.  F/u prn  Abbe Amsterdam, MD

## 2019-10-05 NOTE — Patient Instructions (Signed)
Preventive Care 40-57 Years Old, Female Preventive care refers to visits with your health care provider and lifestyle choices that can promote health and wellness. This includes:  A yearly physical exam. This may also be called an annual well check.  Regular dental visits and eye exams.  Immunizations.  Screening for certain conditions.  Healthy lifestyle choices, such as eating a healthy diet, getting regular exercise, not using drugs or products that contain nicotine and tobacco, and limiting alcohol use. What can I expect for my preventive care visit? Physical exam Your health care provider will check your:  Height and weight. This may be used to calculate body mass index (BMI), which tells if you are at a healthy weight.  Heart rate and blood pressure.  Skin for abnormal spots. Counseling Your health care provider may ask you questions about your:  Alcohol, tobacco, and drug use.  Emotional well-being.  Home and relationship well-being.  Sexual activity.  Eating habits.  Work and work environment.  Method of birth control.  Menstrual cycle.  Pregnancy history. What immunizations do I need?  Influenza (flu) vaccine  This is recommended every year. Tetanus, diphtheria, and pertussis (Tdap) vaccine  You may need a Td booster every 10 years. Varicella (chickenpox) vaccine  You may need this if you have not been vaccinated. Zoster (shingles) vaccine  You may need this after age 60. Measles, mumps, and rubella (MMR) vaccine  You may need at least one dose of MMR if you were born in 1957 or later. You may also need a second dose. Pneumococcal conjugate (PCV13) vaccine  You may need this if you have certain conditions and were not previously vaccinated. Pneumococcal polysaccharide (PPSV23) vaccine  You may need one or two doses if you smoke cigarettes or if you have certain conditions. Meningococcal conjugate (MenACWY) vaccine  You may need this if you  have certain conditions. Hepatitis A vaccine  You may need this if you have certain conditions or if you travel or work in places where you may be exposed to hepatitis A. Hepatitis B vaccine  You may need this if you have certain conditions or if you travel or work in places where you may be exposed to hepatitis B. Haemophilus influenzae type b (Hib) vaccine  You may need this if you have certain conditions. Human papillomavirus (HPV) vaccine  If recommended by your health care provider, you may need three doses over 6 months. You may receive vaccines as individual doses or as more than one vaccine together in one shot (combination vaccines). Talk with your health care provider about the risks and benefits of combination vaccines. What tests do I need? Blood tests  Lipid and cholesterol levels. These may be checked every 5 years, or more frequently if you are over 50 years old.  Hepatitis C test.  Hepatitis B test. Screening  Lung cancer screening. You may have this screening every year starting at age 55 if you have a 30-pack-year history of smoking and currently smoke or have quit within the past 15 years.  Colorectal cancer screening. All adults should have this screening starting at age 50 and continuing until age 75. Your health care provider may recommend screening at age 45 if you are at increased risk. You will have tests every 1-10 years, depending on your results and the type of screening test.  Diabetes screening. This is done by checking your blood sugar (glucose) after you have not eaten for a while (fasting). You may have this   done every 1-3 years.  Mammogram. This may be done every 1-2 years. Talk with your health care provider about when you should start having regular mammograms. This may depend on whether you have a family history of breast cancer.  BRCA-related cancer screening. This may be done if you have a family history of breast, ovarian, tubal, or peritoneal  cancers.  Pelvic exam and Pap test. This may be done every 3 years starting at age 39. Starting at age 29, this may be done every 5 years if you have a Pap test in combination with an HPV test. Other tests  Sexually transmitted disease (STD) testing.  Bone density scan. This is done to screen for osteoporosis. You may have this scan if you are at high risk for osteoporosis. Follow these instructions at home: Eating and drinking  Eat a diet that includes fresh fruits and vegetables, whole grains, lean protein, and low-fat dairy.  Take vitamin and mineral supplements as recommended by your health care provider.  Do not drink alcohol if: ? Your health care provider tells you not to drink. ? You are pregnant, may be pregnant, or are planning to become pregnant.  If you drink alcohol: ? Limit how much you have to 0-1 drink a day. ? Be aware of how much alcohol is in your drink. In the [place] one drink equals one 12 oz bottle of beer (355 mL), one 5 oz glass of wine (148 mL), or one 1 oz glass of hard liquor (44 mL). Lifestyle  Take daily care of your teeth and gums.  Stay active. Exercise for at least 30 minutes on 5 or more days each week.  Do not use any products that contain nicotine or tobacco, such as cigarettes, e-cigarettes, and chewing tobacco. If you need help quitting, ask your health care provider.  If you are sexually active, practice safe sex. Use a condom or other form of birth control (contraception) in order to prevent pregnancy and STIs (sexually transmitted infections).  If told by your health care provider, take low-dose aspirin daily starting at age 2. What's next?  Visit your health care provider once a year for a well check visit.  Ask your health care provider how often you should have your eyes and teeth checked.  Stay up to date on all vaccines. This information is not intended to replace advice given to you by your health care provider. Make sure you  discuss any questions you have with your health care provider. Document Revised: 03/10/2018 Document Reviewed: 03/10/2018 Elsevier Patient Education  [facility] Re[facility] After Surgery Ask your health care provider which exercises are safe for you. Do exercises exactly as told by your health care provider and adjust them as directed. It is normal to feel mild stretching, pulling, tightness, or discomfort as you do these exercises. Stop right away if you feel sudden pain or your pain gets worse. Do not begin these exercises until told by your health care provider. Stretching and range-of-motion exercises These exercises warm up your muscles and joints and improve the movement and flexibility of your shoulder. These exercises also help to relieve pain. Shoulder pendulum In this exercise, you let the injured arm dangle toward the floor and then swing it like a clock pendulum. 1. Stand near a table or counter that you can hold onto for balance. 2. Bend forward at the waist and let your left / right arm hang straight down. Use your other arm  to support you and help you stay balanced. 3. Relax your left / right arm and shoulder muscles, and move your hips and your trunk so your left / right arm swings freely. Your arm should swing because of the motion of your body, not because you are using your arm or shoulder muscles. 4. Keep moving your hips and trunk so your arm swings in the following directions, as told by your health care provider: ? Side to side. ? Forward and backward. ? In clockwise and counterclockwise circles. 5. Slowly return to the starting position. Repeat __________ times, or for __________ seconds per direction. Complete this exercise __________ times a day. Shoulder flexion, seated In this exercise, you raise your arm in front of your body until you feel a stretch in your injured shoulder. 1. Sit in a stable chair so your left / right forearm can rest on a  flat surface. Your elbow should rest at a height that keeps your upper arm next to your body. 2. Keeping your left / right shoulder relaxed, lean forward at the waist and let your hand slide forward (flexion). Stop when you feel a stretch in your shoulder, or when you reach the angle that is recommended by your health care provider. 3. Hold for __________ seconds. 4. Slowly return to the starting position. Repeat __________ times. Complete this exercise __________ times a day. Shoulder flexion, standing In this exercise, you raise your arm in front of your body (flexion) until you feel a stretch in your injured shoulder. 1. Stand and hold a broomstick, a cane, or a similar object. Place your hands a little more than shoulder width apart on the object. Your left / right hand should be palm-up, and your other hand should be palm-down. 2. Keep your elbow straight and your shoulder muscles relaxed. Push the stick up with your healthy arm to raise your left / right arm in front of your body, and then over your head until you feel a stretch in your shoulder. ? Avoid shrugging your shoulder while you raise your arm. Keep your shoulder blade tucked down toward the middle of your back. ? Keep your left / right shoulder muscles relaxed. 3. Hold for __________ seconds. 4. Slowly return to the starting position. Repeat __________ times. Complete this exercise __________ times a day. Shoulder abduction, active-assisted You will need a stick, broom handle, or similar object to help you (assist) in doing this exercise. 1. Lie on your back. This is the supine position. Hold a broomstick, a cane, or a similar object. 2. Place your hands a little more than shoulder width apart on the object. Your left / right hand should be palm-up, and your other hand should be palm-down. 3. Keeping your shoulder relaxed, push the stick to raise your left / right arm out to your side (abduction) and then over your head. Use your  other hand to help move the stick. Stop when you feel a stretch in your shoulder, or when you reach the angle that is recommended by your health care provider. ? Avoid shrugging your shoulder while you raise your arm. Keep your shoulder blade tucked down toward the middle of your back. 4. Hold for __________ seconds. 5. Slowly return to the starting position. Repeat __________ times. Complete this exercise __________ times a day. Shoulder flexion, active-assisted  1. Lie on your back. You may bend your knees for comfort. 2. Hold a broomstick, a cane, or a similar object so that your hands are  about shoulder width apart. Your palms should face toward your feet. 3. Raise your left / right arm over your head, then behind your head toward the floor (flexion). Use your other hand to help you do this (active-assisted). Stop when you feel a gentle stretch in your shoulder, or when you reach the angle that is recommended by your health care provider. 4. Hold for __________ seconds. 5. Use the stick and your other arm to help you return your left / right arm to the starting position. Repeat __________ times. Complete this exercise __________ times a day. External rotation  1. Sit in a stable chair without armrests, or stand up. 2. Tuck a soft object, such as a folded towel or a small ball, under your left / right upper arm. 3. Hold a broomstick, a cane, or a similar object with your palms face-down, toward the floor. Bend your elbows to a 90-degree angle (right angle), and keep your hands about shoulder width apart. 4. Straighten your healthy arm and push the stick across your body, toward your left / right side. Keep your left / right arm bent. This will rotate your left / right forearm away from your body (external rotation). 5. Hold for __________ seconds. 6. Slowly return to the starting position. Repeat __________ times. Complete this exercise __________ times a day. Strengthening exercises These  exercises build strength and endurance in your shoulder. Endurance is the ability to use your muscles for a long time, even after they get tired. Shoulder flexion, isometric  1. Stand or sit in a doorway, facing the door frame. 2. Keep your left / right arm straight and make a gentle fist with your hand. Place your fist against the door frame. Only your fist should be touching the frame. Keep your upper arm at your side. 3. Gently press your fist against the door frame, as if you are trying to raise your arm above your head (isometric shoulder flexion). ? Avoid shrugging your shoulder while you press your hand into the door frame. Keep your shoulder blade tucked down toward the middle of your back. 4. Hold for __________ seconds. 5. Slowly release the tension, and relax your muscles completely before you repeat the exercise. Repeat __________ times. Complete this exercise __________ times a day. Shoulder abduction, isometric 1. Stand or sit in a doorway. Your left / right arm should be closest to the door frame. 2. Keep your left / right arm straight, and place the back of your hand against the door frame. Only your hand should be touching the frame. Keep the rest of your arm close to your side. 3. Gently press the back of your hand against the door frame, as if you are trying to raise your arm out to the side (isometric shoulder abduction). ? Avoid shrugging your shoulder while you press your hand into the door frame. Keep your shoulder blade tucked down toward the middle of your back. 4. Hold for __________ seconds. 5. Slowly release the tension, and relax your muscles completely before you repeat the exercise. Repeat __________ times. Complete this exercise __________ times a day. Internal rotation, isometric This is an exercise in which you press your palm against a door frame without moving your shoulder joint (isometric). 1. Stand or sit in a doorway, facing the door frame. 2. Bend your  left / right elbow, and place the palm of your hand against the door frame. Only your palm should be touching the frame. Keep your upper arm at  your side. 3. Gently press your hand against the door frame, as if you are trying to push your arm toward your abdomen (internal rotation). Gradually increase the pressure until you are pressing as hard as you can. Stop increasing the pressure if you feel shoulder pain. ? Avoid shrugging your shoulder while you press your hand into the door frame. Keep your shoulder blade tucked down toward the middle of your back. 4. Hold for __________ seconds. 5. Slowly release the tension, and relax your muscles completely before you repeat the exercise. Repeat __________ times. Complete this exercise __________ times a day. External rotation, isometric This is an exercise in which you press the back of your wrist against a door frame without moving your shoulder joint (isometric). 1. Stand or sit in a doorway, facing the door frame. 2. Bend your left / right elbow and place the back of your wrist against the door frame. Only the back of your wrist should be touching the frame. Keep your upper arm at your side. 3. Gently press your wrist against the door frame, as if you are trying to push your arm away from your abdomen (external rotation). Gradually increase the pressure until you are pressing as hard as you can. Stop increasing the pressure if you feel pain. ? Avoid shrugging your shoulder while you press your wrist into the door frame. Keep your shoulder blade tucked down toward the middle of your back. 4. Hold for __________ seconds. 5. Slowly release the tension, and relax your muscles completely before you repeat the exercise. Repeat __________ times. Complete this exercise __________ times a day. This information is not intended to replace advice given to you by your health care provider. Make sure you discuss any questions you have with your health care  provider. Document Revised: 10/21/2018 Document Reviewed: 10/11/2018 Elsevier Patient Education  [place].

## 2019-10-06 ENCOUNTER — Encounter: Payer: Self-pay | Admitting: Family Medicine

## 2019-10-09 ENCOUNTER — Telehealth: Payer: Self-pay | Admitting: Family Medicine

## 2019-10-09 NOTE — Telephone Encounter (Signed)
Pt is calling to let you know that she still has not received a message about her Mychart and it's been a week. Per pt, you told her to call you back if she hadn't heard anything. Thanks

## 2019-10-09 NOTE — Telephone Encounter (Signed)
Spoke with pt attempted to send pt the link for MyChart on pt mobile phone, advised pt that I will follow up in the morning to check if she received the link

## 2019-10-10 NOTE — Telephone Encounter (Signed)
Pt received MyChart link and was successfully with registration.

## 2019-10-11 ENCOUNTER — Telehealth: Payer: Self-pay | Admitting: Hematology & Oncology

## 2019-10-11 NOTE — Telephone Encounter (Signed)
Called and spoke with patient about New Patient Appointment date/time/location was given to her

## 2019-10-20 ENCOUNTER — Ambulatory Visit: Payer: 59 | Admitting: Hematology & Oncology

## 2019-10-20 ENCOUNTER — Inpatient Hospital Stay: Payer: 59 | Attending: Hematology & Oncology

## 2019-10-20 ENCOUNTER — Inpatient Hospital Stay (HOSPITAL_BASED_OUTPATIENT_CLINIC_OR_DEPARTMENT_OTHER): Payer: 59 | Admitting: Hematology & Oncology

## 2019-10-20 ENCOUNTER — Other Ambulatory Visit: Payer: Self-pay

## 2019-10-20 VITALS — BP 140/75 | HR 73 | Temp 97.5°F | Resp 17 | Wt 142.0 lb

## 2019-10-20 DIAGNOSIS — Z800 Family history of malignant neoplasm of digestive organs: Secondary | ICD-10-CM | POA: Insufficient documentation

## 2019-10-20 DIAGNOSIS — D708 Other neutropenia: Secondary | ICD-10-CM

## 2019-10-20 DIAGNOSIS — D72819 Decreased white blood cell count, unspecified: Secondary | ICD-10-CM | POA: Diagnosis present

## 2019-10-20 LAB — SAVE SMEAR(SSMR), FOR PROVIDER SLIDE REVIEW

## 2019-10-20 LAB — CBC WITH DIFFERENTIAL (CANCER CENTER ONLY)
Abs Immature Granulocytes: 0.01 10*3/uL (ref 0.00–0.07)
Basophils Absolute: 0 10*3/uL (ref 0.0–0.1)
Basophils Relative: 1 %
Eosinophils Absolute: 0 10*3/uL (ref 0.0–0.5)
Eosinophils Relative: 1 %
HCT: 37.1 % (ref 36.0–46.0)
Hemoglobin: 11.9 g/dL — ABNORMAL LOW (ref 12.0–15.0)
Immature Granulocytes: 0 %
Lymphocytes Relative: 50 %
Lymphs Abs: 1.5 10*3/uL (ref 0.7–4.0)
MCH: 27.9 pg (ref 26.0–34.0)
MCHC: 32.1 g/dL (ref 30.0–36.0)
MCV: 87.1 fL (ref 80.0–100.0)
Monocytes Absolute: 0.2 10*3/uL (ref 0.1–1.0)
Monocytes Relative: 6 %
Neutro Abs: 1.3 10*3/uL — ABNORMAL LOW (ref 1.7–7.7)
Neutrophils Relative %: 42 %
Platelet Count: 366 10*3/uL (ref 150–400)
RBC: 4.26 MIL/uL (ref 3.87–5.11)
RDW: 13.8 % (ref 11.5–15.5)
WBC Count: 3 10*3/uL — ABNORMAL LOW (ref 4.0–10.5)
nRBC: 0 % (ref 0.0–0.2)

## 2019-10-20 LAB — VITAMIN B12: Vitamin B-12: 1130 pg/mL — ABNORMAL HIGH (ref 180–914)

## 2019-10-20 NOTE — Progress Notes (Signed)
Referral MD  Reason for Referral: Leukopenia  Chief Complaint  Patient presents with  . New Patient (Initial Visit)  : My white cells are low.  HPI: Catherine Conway is a very charming 59 year old African-American female.  She is originally from the [place] the [place].  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  She comes in with her husband.  I must say that we had very good fellowship.  They are very faith-based and is wonderful to talk to them about their faith.  She is followed by Dr. Volanda Napoleon.  Dr. Volanda Napoleon has noticed that her white cell count has been going down.  Going back to 2013, her white cell count was 3.9.  A year later the white cell count 3.8.  About 2 weeks ago, the white cell count was 2.9.  Her hemoglobin was13.7.  Platelet count 304.  Her white cell differential was 39 segs 55 lymphs 6 monos 1 eosinophil.  Because of this, it was felt that she needed to be seen by [facility] for an evaluation.  Catherine Conway has never had any problems with infections.  She has had no fever.  She had her coronavirus vaccine back in February.  She has had no problems with lymph nodes.  She has had no abdominal pain.  She is had no joint problems.  She is not a vegetarian.  She has had no tobacco history.  She does not drink alcohol.  She does not have children.  There is no problems with bowels or bladder.  There is no history of sickle cell in the family.  Her mother had colon cancer at age 7.  Catherine Conway colonoscopy was about 7 years ago.  Overall, her performance status is ECOG 0.    No past medical history on file.:  No past surgical history on file.:   Current Outpatient Medications:  .  Ascorbic Acid (VITAMIN C PO), Take by mouth daily., Disp: , Rfl:  .  Cholecalciferol (VITAMIN D PO), Take by mouth daily., Disp: , Rfl:  .  fluticasone (FLONASE) 50 MCG/ACT nasal spray, Place 2 sprays into both nostrils daily., Disp: 16 g,  Rfl: 6 .  Glucosamine HCl (GLUCOSAMINE PO), Take by mouth daily., Disp: , Rfl:  .  NON FORMULARY, Take by mouth daily. NutraLite Multivitamins., Disp: , Rfl:  .  NON FORMULARY, Fruit and vegetables 2 tablets daily, Disp: , Rfl:  .  NON FORMULARY, [employer] essential health 1 tablet daily, Disp: , Rfl: :  :  No Known Allergies:  Family History  Problem Relation Age of Onset  . Colon cancer Mother 17  . Esophageal cancer Neg Hx   . Stomach cancer Neg Hx   . Rectal cancer Neg Hx   :  Social History   Socioeconomic History  . Marital status: Single    Spouse name: Not on file  . Number of children: Not on file  . Years of education: Not on file  . Highest education level: Not on file  Occupational History  . Not on file  Tobacco Use  . Smoking status: Never Smoker  . Smokeless tobacco: Never Used  Substance and Sexual Activity  . Alcohol use: No  . Drug use: No  . Sexual activity: Not on file  Other Topics Concern  . Not on file  Social History Narrative   Work or School: Public house manager Situation: lives with her sister  Spiritual Beliefs: Christian      Lifestyle: 5 days per week of exercise - working with trainer; eats healthy      Social Determinants of Health   Financial Resource Strain:   . Difficulty of Paying Living Expenses:   Food Insecurity:   . Worried About Programme researcher, broadcasting/film/video in the Last Year:   . Barista in the Last Year:   Transportation Needs:   . Freight forwarder (Medical):   Marland Kitchen Lack of Transportation (Non-Medical):   Physical Activity:   . Days of Exercise per Week:   . Minutes of Exercise per Session:   Stress:   . Feeling of Stress :   Social Connections:   . Frequency of Communication with Friends and Family:   . Frequency of Social Gatherings with Friends and Family:   . Attends Religious Services:   . Active Member of Clubs or Organizations:   . Attends Banker Meetings:   Marland Kitchen Marital Status:    Intimate Partner Violence:   . Fear of Current or Ex-Partner:   . Emotionally Abused:   Marland Kitchen Physically Abused:   . Sexually Abused:   :  Review of Systems  Constitutional: Negative.   HENT: Negative.   Eyes: Negative.   Respiratory: Negative.   Cardiovascular: Negative.   Gastrointestinal: Negative.   Genitourinary: Negative.   Musculoskeletal: Negative.   Skin: Negative.   Neurological: Negative.   Endo/Heme/Allergies: Negative.   Psychiatric/Behavioral: Negative.      Exam:  Physical Exam Vitals reviewed.  HENT:     Head: Normocephalic and atraumatic.  Eyes:     Pupils: Pupils are equal, round, and reactive to light.  Cardiovascular:     Rate and Rhythm: Normal rate and regular rhythm.     Heart sounds: Normal heart sounds.  Pulmonary:     Effort: Pulmonary effort is normal.     Breath sounds: Normal breath sounds.  Abdominal:     General: Bowel sounds are normal.     Palpations: Abdomen is soft.  Musculoskeletal:        General: No tenderness or deformity. Normal range of motion.     Cervical back: Normal range of motion.  Lymphadenopathy:     Cervical: No cervical adenopathy.  Skin:    General: Skin is warm and dry.     Findings: No erythema or rash.  Neurological:     Mental Status: She is alert and oriented to person, place, and time.  Psychiatric:        Behavior: Behavior normal.        Thought Content: Thought content normal.        Judgment: Judgment normal.     @IPVITALS @   Recent Labs    10/20/19 1041  WBC 3.0*  HGB 11.9*  HCT 37.1  PLT 366   No results for input(s): NA, K, CL, CO2, GLUCOSE, BUN, CREATININE, CALCIUM in the last 72 hours.  Blood smear review: Normochromic and normocytic population of red blood cells.  There are no nucleated red blood cells.  There are no teardrop cells.  I see no schistocytes or spherocytes.  She has no target cells.  White blood cells are slightly decreased in number.  She has no hyper segmented polys.   She has no immature myeloid or lymphoid cells.  There are mature lymphocytes.  Platelets are adequate number and size.  Platelets are well granulated.  Pathology: None    Assessment and Plan: Ms.  Caskey is a very charming 59 year old African-American female.  She has leukopenia.  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  She has the proper white cell differential that we see with I think associated leukopenia.  She is totally asymptomatic.  It is possible that having the coronavirus vaccine also may have dropped her white cell count a little bit.  I do not see anything that would suggest a collagen vascular disease.  I see nothing that looks like rheumatoid arthritis or lupus.  She is not a vegetarian so I would not think that vitamin B12 deficiency is a problem.  Her exam is unremarkable for hepatomegaly or splenomegaly.  I really think that this leukopenia is going to be chronic.  I think it will be totally asymptomatic for her.  I think we probably could see her back in 6 months.  I think this would be a very reasonable way to go with her.  She is totally asymptomatic.  She is very healthy.  She is very active.  She works out.  I spent about 45 minutes with she and her husband.  We had excellent fellowship.  It was nice that we could share our faith together.  I gave her a prayer blanket which she very much appreciated.

## 2019-10-21 LAB — RHEUMATOID FACTOR: Rhuematoid fact SerPl-aCnc: <10 IU/mL (ref 0.0–13.9)

## 2019-10-23 ENCOUNTER — Encounter: Payer: Self-pay | Admitting: Family Medicine

## 2019-10-23 LAB — ANTINUCLEAR ANTIBODIES, IFA: ANA Ab, IFA: NEGATIVE

## 2019-10-23 LAB — LACTATE DEHYDROGENASE: LDH: 245 U/L — ABNORMAL HIGH (ref 98–192)

## 2019-10-27 ENCOUNTER — Other Ambulatory Visit: Payer: 59

## 2019-10-27 NOTE — Telephone Encounter (Signed)
Form printed and placed on Dr Salomon Fick desk for review

## 2019-11-01 ENCOUNTER — Telehealth: Payer: Self-pay | Admitting: Family Medicine

## 2019-11-01 NOTE — Telephone Encounter (Signed)
Pt called in regards to a form that was being completed by Dr. Salomon Fick and was rejected because of the date being incorrect. Pt is requesting a call back from [place] to go over this.

## 2019-11-02 ENCOUNTER — Encounter: Payer: Self-pay | Admitting: Family Medicine

## 2019-11-03 NOTE — Telephone Encounter (Signed)
Pt returned call from [place], she wanted to let her know that she sent another copy with her signature on mychart.She would like to know if that one could be used instead of her having to come into the office.

## 2019-11-06 NOTE — Telephone Encounter (Signed)
Pt physician results form was refax to [facility], confirmation received

## 2019-12-25 ENCOUNTER — Encounter: Payer: Self-pay | Admitting: Family Medicine

## 2020-04-19 ENCOUNTER — Other Ambulatory Visit: Payer: Self-pay

## 2020-04-19 ENCOUNTER — Encounter: Payer: Self-pay | Admitting: Hematology & Oncology

## 2020-04-19 ENCOUNTER — Inpatient Hospital Stay: Payer: 59 | Attending: Hematology & Oncology

## 2020-04-19 ENCOUNTER — Inpatient Hospital Stay (HOSPITAL_BASED_OUTPATIENT_CLINIC_OR_DEPARTMENT_OTHER): Payer: 59 | Admitting: Hematology & Oncology

## 2020-04-19 VITALS — BP 129/78 | HR 57 | Temp 98.2°F | Resp 16 | Wt 145.0 lb

## 2020-04-19 DIAGNOSIS — D708 Other neutropenia: Secondary | ICD-10-CM

## 2020-04-19 DIAGNOSIS — D72819 Decreased white blood cell count, unspecified: Secondary | ICD-10-CM | POA: Diagnosis present

## 2020-04-19 LAB — CBC WITH DIFFERENTIAL (CANCER CENTER ONLY)
Abs Immature Granulocytes: 0.01 10*3/uL (ref 0.00–0.07)
Basophils Absolute: 0 10*3/uL (ref 0.0–0.1)
Basophils Relative: 1 %
Eosinophils Absolute: 0.1 10*3/uL (ref 0.0–0.5)
Eosinophils Relative: 1 %
HCT: 40.7 % (ref 36.0–46.0)
Hemoglobin: 13.4 g/dL (ref 12.0–15.0)
Immature Granulocytes: 0 %
Lymphocytes Relative: 64 %
Lymphs Abs: 2.4 10*3/uL (ref 0.7–4.0)
MCH: 29.3 pg (ref 26.0–34.0)
MCHC: 32.9 g/dL (ref 30.0–36.0)
MCV: 88.9 fL (ref 80.0–100.0)
Monocytes Absolute: 0.3 10*3/uL (ref 0.1–1.0)
Monocytes Relative: 8 %
Neutro Abs: 1 10*3/uL — ABNORMAL LOW (ref 1.7–7.7)
Neutrophils Relative %: 26 %
Platelet Count: 291 10*3/uL (ref 150–400)
RBC: 4.58 MIL/uL (ref 3.87–5.11)
RDW: 14.6 % (ref 11.5–15.5)
WBC Count: 3.8 10*3/uL — ABNORMAL LOW (ref 4.0–10.5)
nRBC: 0 % (ref 0.0–0.2)

## 2020-04-19 LAB — CMP (CANCER CENTER ONLY)
ALT: 25 U/L (ref 0–44)
AST: 25 U/L (ref 15–41)
Albumin: 4.4 g/dL (ref 3.5–5.0)
Alkaline Phosphatase: 54 U/L (ref 38–126)
Anion gap: 5 (ref 5–15)
BUN: 19 mg/dL (ref 6–20)
CO2: 31 mmol/L (ref 22–32)
Calcium: 9.9 mg/dL (ref 8.9–10.3)
Chloride: 107 mmol/L (ref 98–111)
Creatinine: 0.82 mg/dL (ref 0.44–1.00)
GFR, Estimated: >60 mL/min (ref >60)
Glucose, Bld: 98 mg/dL (ref 70–99)
Potassium: 4.5 mmol/L (ref 3.5–5.1)
Sodium: 143 mmol/L (ref 135–145)
Total Bilirubin: 0.4 mg/dL (ref 0.3–1.2)
Total Protein: 7.2 g/dL (ref 6.5–8.1)

## 2020-04-19 LAB — SAVE SMEAR(SSMR), FOR PROVIDER SLIDE REVIEW

## 2020-04-19 NOTE — Progress Notes (Signed)
Hematology and Oncology Follow Up Visit  Catherine Conway [id number] Nov 06, 1960 59 y.o. 04/19/2020   Principle Diagnosis:   Ethnic associated leukopenia  Current Therapy:    Observation     Interim History:  Catherine Conway is back for follow-up.  This is her second office visit.  I first saw her back in April.  Since then, she has done well.  She and her fianc have been down to [place], [place].  They had a nice time down there.  Her work-up so far has been totally unremarkable.  I suspect that she has ethnic associated leukopenia.  Going back to 2014, her white cell count really has not changed.  She has a reverse ratio of neutrophils to lymphocytes.  This also goes along with ethnic associated leukopenia.  She has had no problems with infections.  She has had her COVID vaccines.  She has had no nausea or vomiting.  Is been no change in bowel or bladder habits.  She has had no rashes.  She has had no leg swelling.  She is still exercising quite a bit.  Overall, her performance status is ECOG 0.  Medications:  Current Outpatient Medications:  .  Ascorbic Acid (VITAMIN C PO), Take by mouth daily., Disp: , Rfl:  .  Cholecalciferol (VITAMIN D PO), Take by mouth daily., Disp: , Rfl:  .  NON FORMULARY, Take by mouth daily. NutraLite Multivitamins., Disp: , Rfl:  .  triamcinolone cream (KENALOG) 0.1 %, Apply topically 2 (two) times daily., Disp: , Rfl:   Allergies:  Allergies  Allergen Reactions  . No Known Allergies     Past Medical History, Surgical history, Social history, and Family History were reviewed and updated.  Review of Systems: Review of Systems  Constitutional: Negative.   HENT:  Negative.   Eyes: Negative.   Respiratory: Negative.   Cardiovascular: Negative.   Gastrointestinal: Negative.   Endocrine: Negative.   Genitourinary: Negative.    Musculoskeletal: Negative.   Skin: Negative.   Neurological: Negative.   Hematological: Negative.    Psychiatric/Behavioral: Negative.     Physical Exam:  weight is 145 lb (65.8 kg). Her oral temperature is 98.2 F (36.8 C). Her blood pressure is 129/78 and her pulse is 57 (abnormal). Her respiration is 16 and oxygen saturation is 100%.   Wt Readings from Last 3 Encounters:  04/19/20 145 lb (65.8 kg)  10/20/19 142 lb (64.4 kg)  10/05/19 141 lb (64 kg)    Physical Exam Vitals reviewed.  HENT:     Head: Normocephalic and atraumatic.  Eyes:     Pupils: Pupils are equal, round, and reactive to light.  Cardiovascular:     Rate and Rhythm: Normal rate and regular rhythm.     Heart sounds: Normal heart sounds.  Pulmonary:     Effort: Pulmonary effort is normal.     Breath sounds: Normal breath sounds.  Abdominal:     General: Bowel sounds are normal.     Palpations: Abdomen is soft.  Musculoskeletal:        General: No tenderness or deformity. Normal range of motion.     Cervical back: Normal range of motion.  Lymphadenopathy:     Cervical: No cervical adenopathy.  Skin:    General: Skin is warm and dry.     Findings: No erythema or rash.  Neurological:     Mental Status: She is alert and oriented to person, place, and time.  Psychiatric:  Behavior: Behavior normal.        Thought Content: Thought content normal.        Judgment: Judgment normal.      Lab Results  Component Value Date   WBC 3.8 (L) 04/19/2020   HGB 13.4 04/19/2020   HCT 40.7 04/19/2020   MCV 88.9 04/19/2020   PLT 291 04/19/2020     Chemistry      Component Value Date/Time   NA 143 04/19/2020 0906   K 4.5 04/19/2020 0906   CL 107 04/19/2020 0906   CO2 31 04/19/2020 0906   BUN 19 04/19/2020 0906   CREATININE 0.82 04/19/2020 0906      Component Value Date/Time   CALCIUM 9.9 04/19/2020 0906   ALKPHOS 54 04/19/2020 0906   AST 25 04/19/2020 0906   ALT 25 04/19/2020 0906   BILITOT 0.4 04/19/2020 0906      Impression and Plan: Catherine Conway is a very nice 59 year old African-American  female.  She has leukopenia.  Again, I believe this is ethnic associated leukopenia.  Under the microscope, her blood smear is pretty much benign.  She does have an increase in lymphocytes which appear mature.  Her monocytes appear okay.  She does not have any hypersegmented polys.  There are no immature myeloid cells.  I think we can probably get her back in about 6 months again.  If there is really no change in her blood counts at that time, then we will let her go from the clinic.   Josph Macho, MD 10/8/202110:05 AM

## 2020-08-05 ENCOUNTER — Other Ambulatory Visit (HOSPITAL_BASED_OUTPATIENT_CLINIC_OR_DEPARTMENT_OTHER): Payer: Self-pay | Admitting: Family Medicine

## 2020-08-05 DIAGNOSIS — Z1231 Encounter for screening mammogram for malignant neoplasm of breast: Secondary | ICD-10-CM

## 2020-08-26 ENCOUNTER — Other Ambulatory Visit: Payer: Self-pay

## 2020-08-26 ENCOUNTER — Ambulatory Visit (HOSPITAL_BASED_OUTPATIENT_CLINIC_OR_DEPARTMENT_OTHER)
Admission: RE | Admit: 2020-08-26 | Discharge: 2020-08-26 | Disposition: A | Discharge status: Home / Self Care | Payer: 59 | Source: Ambulatory Visit | Attending: Family Medicine | Admitting: Family Medicine

## 2020-08-26 DIAGNOSIS — Z1231 Encounter for screening mammogram for malignant neoplasm of breast: Secondary | ICD-10-CM | POA: Diagnosis not present

## 2020-08-29 LAB — HM MAMMOGRAPHY

## 2020-08-30 ENCOUNTER — Encounter: Payer: Self-pay | Admitting: Family Medicine

## 2020-09-30 ENCOUNTER — Other Ambulatory Visit: Payer: Self-pay

## 2020-10-01 ENCOUNTER — Ambulatory Visit (INDEPENDENT_AMBULATORY_CARE_PROVIDER_SITE_OTHER): Payer: 59 | Admitting: *Deleted

## 2020-10-01 DIAGNOSIS — Z23 Encounter for immunization: Secondary | ICD-10-CM | POA: Diagnosis not present

## 2020-12-02 ENCOUNTER — Inpatient Hospital Stay (HOSPITAL_BASED_OUTPATIENT_CLINIC_OR_DEPARTMENT_OTHER): Payer: 59 | Admitting: Hematology & Oncology

## 2020-12-02 ENCOUNTER — Inpatient Hospital Stay: Payer: 59 | Attending: Hematology & Oncology

## 2020-12-02 ENCOUNTER — Other Ambulatory Visit: Payer: Self-pay

## 2020-12-02 ENCOUNTER — Encounter: Payer: Self-pay | Admitting: Hematology & Oncology

## 2020-12-02 VITALS — BP 121/78 | HR 61 | Temp 98.8°F | Resp 20 | Wt 151.8 lb

## 2020-12-02 DIAGNOSIS — D708 Other neutropenia: Secondary | ICD-10-CM | POA: Diagnosis not present

## 2020-12-02 DIAGNOSIS — D72819 Decreased white blood cell count, unspecified: Secondary | ICD-10-CM | POA: Insufficient documentation

## 2020-12-02 LAB — CBC WITH DIFFERENTIAL (CANCER CENTER ONLY)
Abs Immature Granulocytes: 0.02 10*3/uL (ref 0.00–0.07)
Basophils Absolute: 0 10*3/uL (ref 0.0–0.1)
Basophils Relative: 1 %
Eosinophils Absolute: 0.1 10*3/uL (ref 0.0–0.5)
Eosinophils Relative: 2 %
HCT: 37.3 % (ref 36.0–46.0)
Hemoglobin: 12.3 g/dL (ref 12.0–15.0)
Immature Granulocytes: 1 %
Lymphocytes Relative: 51 %
Lymphs Abs: 2.1 10*3/uL (ref 0.7–4.0)
MCH: 29.6 pg (ref 26.0–34.0)
MCHC: 33 g/dL (ref 30.0–36.0)
MCV: 89.9 fL (ref 80.0–100.0)
Monocytes Absolute: 0.3 10*3/uL (ref 0.1–1.0)
Monocytes Relative: 7 %
Neutro Abs: 1.5 10*3/uL — ABNORMAL LOW (ref 1.7–7.7)
Neutrophils Relative %: 38 %
Platelet Count: 256 10*3/uL (ref 150–400)
RBC: 4.15 MIL/uL (ref 3.87–5.11)
RDW: 13.5 % (ref 11.5–15.5)
WBC Count: 4 10*3/uL (ref 4.0–10.5)
nRBC: 0 % (ref 0.0–0.2)

## 2020-12-02 LAB — SAVE SMEAR(SSMR), FOR PROVIDER SLIDE REVIEW

## 2020-12-02 NOTE — Progress Notes (Signed)
Hematology and Oncology Follow Up Visit  Catherine Conway [id number] [birth year] 60 y.o. 12/02/2020   Principle Diagnosis:   Ethnic associated leukopenia  Current Therapy:    Observation     Interim History:  Catherine Conway is back for follow-up.  I see Catherine Conway 6 months.  So far, Catherine Conway is doing quite well.  Catherine Conway had a wonderful weekend.  Catherine Conway and Catherine Conway husband were in [place], [place].  Catherine Conway has had no problems with infections.  There is been no fever.  Catherine Conway has had no rashes.  There has been no change in bowel or bladder habits.  Catherine Conway has had no cough.  There has been no leg swelling.  Catherine Conway has had a good appetite.  There is been no headache.  Overall, Catherine Conway performance status is ECOG 0.    Medications:  Current Outpatient Medications:  .  Ascorbic Acid (VITAMIN C PO), Take by mouth daily., Disp: , Rfl:  .  Cholecalciferol (VITAMIN D PO), Take by mouth daily., Disp: , Rfl:  .  NON FORMULARY, Take by mouth daily. NutraLite Multivitamins., Disp: , Rfl:  .  triamcinolone cream (KENALOG) 0.1 %, Apply topically 2 (two) times daily., Disp: , Rfl:   Allergies:  Allergies  Allergen Reactions  . No Known Allergies     Past Medical History, Surgical history, Social history, and Family History were reviewed and updated.  Review of Systems: Review of Systems  Constitutional: Negative.   HENT:  Negative.   Eyes: Negative.   Respiratory: Negative.   Cardiovascular: Negative.   Gastrointestinal: Negative.   Endocrine: Negative.   Genitourinary: Negative.    Musculoskeletal: Negative.   Skin: Negative.   Neurological: Negative.   Hematological: Negative.   Psychiatric/Behavioral: Negative.     Physical Exam:  weight is 151 lb 12.8 oz (68.9 kg). Catherine Conway oral temperature is 98.8 F (37.1 C). Catherine Conway blood pressure is 121/78 and Catherine Conway pulse is 61. Catherine Conway respiration is 20 and oxygen saturation is 99%.   Wt Readings from Last 3 Encounters:  12/02/20 151 lb 12.8 oz (68.9 kg)  04/19/20 145 lb (65.8 kg)   10/20/19 142 lb (64.4 kg)    Physical Exam Vitals reviewed.  HENT:     Head: Normocephalic and atraumatic.  Eyes:     Pupils: Pupils are equal, round, and reactive to light.  Cardiovascular:     Rate and Rhythm: Normal rate and regular rhythm.     Heart sounds: Normal heart sounds.  Pulmonary:     Effort: Pulmonary effort is normal.     Breath sounds: Normal breath sounds.  Abdominal:     General: Bowel sounds are normal.     Palpations: Abdomen is soft.  Musculoskeletal:        General: No tenderness or deformity. Normal range of motion.     Cervical back: Normal range of motion.  Lymphadenopathy:     Cervical: No cervical adenopathy.  Skin:    General: Skin is warm and dry.     Findings: No erythema or rash.  Neurological:     Mental Status: Catherine Conway is alert and oriented to person, place, and time.  Psychiatric:        Behavior: Behavior normal.        Thought Content: Thought content normal.        Judgment: Judgment normal.      Lab Results  Component Value Date   WBC 4.0 12/02/2020   HGB 12.3 12/02/2020   HCT 37.3 12/02/2020   MCV  89.9 12/02/2020   PLT 256 12/02/2020     Chemistry      Component Value Date/Time   NA 143 04/19/2020 0906   K 4.5 04/19/2020 0906   CL 107 04/19/2020 0906   CO2 31 04/19/2020 0906   BUN 19 04/19/2020 0906   CREATININE 0.82 04/19/2020 0906      Component Value Date/Time   CALCIUM 9.9 04/19/2020 0906   ALKPHOS 54 04/19/2020 0906   AST 25 04/19/2020 0906   ALT 25 04/19/2020 0906   BILITOT 0.4 04/19/2020 0906      Impression and Plan: Ms. Saal is a very nice 60 year old African-American female.  Catherine Conway has leukopenia.  Again, I believe this is ethnic associated leukopenia.  Under the microscope, Catherine Conway blood smear is pretty much benign.  Catherine Conway does have an increase in lymphocytes which appear mature.  Catherine Conway monocytes appear okay.  Catherine Conway does not have any hypersegmented polys.  There are no immature myeloid cells.  Every time we see  Catherine Conway, Catherine Conway blood count gets better.  At this point, I think we can probably let Catherine Conway go from the clinic.  I am just not sure that we really are making a difference with Catherine Conway health care.  If Catherine Conway has any problems in the future, we will be more than happy to see Catherine Conway.   Josph Macho, MD 5/23/20228:08 AM

## 2020-12-03 ENCOUNTER — Telehealth: Payer: Self-pay

## 2020-12-03 NOTE — Telephone Encounter (Signed)
No 12/02/20 los   Catherine Conway 

## 2020-12-09 ENCOUNTER — Encounter: Payer: Self-pay | Admitting: Hematology & Oncology

## 2021-03-19 ENCOUNTER — Encounter: Payer: Self-pay | Admitting: Family Medicine

## 2021-04-16 ENCOUNTER — Other Ambulatory Visit: Payer: Self-pay

## 2021-04-17 ENCOUNTER — Ambulatory Visit (INDEPENDENT_AMBULATORY_CARE_PROVIDER_SITE_OTHER): Payer: BC Managed Care – PPO | Admitting: Family Medicine

## 2021-04-17 ENCOUNTER — Encounter: Payer: Self-pay | Admitting: Family Medicine

## 2021-04-17 VITALS — BP 134/78 | HR 59 | Temp 98.2°F | Ht 62.0 in | Wt 149.6 lb

## 2021-04-17 DIAGNOSIS — E782 Mixed hyperlipidemia: Secondary | ICD-10-CM | POA: Diagnosis not present

## 2021-04-17 DIAGNOSIS — Z23 Encounter for immunization: Secondary | ICD-10-CM | POA: Diagnosis not present

## 2021-04-17 DIAGNOSIS — Z0000 Encounter for general adult medical examination without abnormal findings: Secondary | ICD-10-CM

## 2021-04-17 DIAGNOSIS — D708 Other neutropenia: Secondary | ICD-10-CM | POA: Diagnosis not present

## 2021-04-17 DIAGNOSIS — R7303 Prediabetes: Secondary | ICD-10-CM

## 2021-04-17 LAB — BASIC METABOLIC PANEL
BUN: 15 mg/dL (ref 6–23)
CO2: 30 mEq/L (ref 19–32)
Calcium: 9.9 mg/dL (ref 8.4–10.5)
Chloride: 102 mEq/L (ref 96–112)
Creatinine, Ser: 0.74 mg/dL (ref 0.40–1.20)
GFR: 89.18 mL/min (ref >60.00)
Glucose, Bld: 86 mg/dL (ref 70–99)
Potassium: 3.8 mEq/L (ref 3.5–5.1)
Sodium: 140 mEq/L (ref 135–145)

## 2021-04-17 LAB — CBC WITH DIFFERENTIAL/PLATELET
Basophils Absolute: 0 10*3/uL (ref 0.0–0.1)
Basophils Relative: 0.4 % (ref 0.0–3.0)
Eosinophils Absolute: 0 10*3/uL (ref 0.0–0.7)
Eosinophils Relative: 0.6 % (ref 0.0–5.0)
HCT: 39.3 % (ref 36.0–46.0)
Hemoglobin: 13 g/dL (ref 12.0–15.0)
Lymphocytes Relative: 51.3 % — ABNORMAL HIGH (ref 12.0–46.0)
Lymphs Abs: 1.7 10*3/uL (ref 0.7–4.0)
MCHC: 33 g/dL (ref 30.0–36.0)
MCV: 89 fl (ref 78.0–100.0)
Monocytes Absolute: 0.2 10*3/uL (ref 0.1–1.0)
Monocytes Relative: 6.8 % (ref 3.0–12.0)
Neutro Abs: 1.4 10*3/uL (ref 1.4–7.7)
Neutrophils Relative %: 40.9 % — ABNORMAL LOW (ref 43.0–77.0)
Platelets: 287 10*3/uL (ref 150.0–400.0)
RBC: 4.42 Mil/uL (ref 3.87–5.11)
RDW: 14.2 % (ref 11.5–15.5)
WBC: 3.3 10*3/uL — ABNORMAL LOW (ref 4.0–10.5)

## 2021-04-17 LAB — LIPID PANEL
Cholesterol: 210 mg/dL — ABNORMAL HIGH (ref 0–200)
HDL: 119.7 mg/dL (ref >39.00)
LDL Cholesterol: 82 mg/dL (ref 0–99)
NonHDL: 90.74
Total CHOL/HDL Ratio: 2
Triglycerides: 43 mg/dL (ref 0.0–149.0)
VLDL: 8.6 mg/dL (ref 0.0–40.0)

## 2021-04-17 LAB — T4, FREE: Free T4: 0.66 ng/dL (ref 0.60–1.60)

## 2021-04-17 LAB — HEMOGLOBIN A1C: Hgb A1c MFr Bld: 6.3 % (ref 4.6–6.5)

## 2021-04-17 LAB — TSH: TSH: 0.61 u[IU]/mL (ref 0.35–5.50)

## 2021-04-17 NOTE — Progress Notes (Signed)
Subjective:     Catherine Conway is a 60 y.o. female and is here for a comprehensive physical exam. The patient reports no problems.  Doing well.  Exercising regularly and working out with a trainer twice a week.  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  Mammogram done 08/29/20.  Social History   Socioeconomic History   Marital status: Single    Spouse name: Not on file   Number of children: Not on file   Years of education: Not on file   Highest education level: Not on file  Occupational History   Not on file  Tobacco Use   Smoking status: Never   Smokeless tobacco: Never  Vaping Use   Vaping Use: Never used  Substance and Sexual Activity   Alcohol use: No   Drug use: No   Sexual activity: Not on file  Other Topics Concern   Not on file  Social History Narrative   Work or School: Education officer, environmental      Home Situation: lives with her sister      Spiritual Beliefs: Christian      Lifestyle: 5 days per week of exercise - working with Psychologist, educational; eats healthy      Social Determinants of Health   Financial Resource Strain: Not on file  Food Insecurity: Not on file  Transportation Needs: Not on file  Physical Activity: Not on file  Stress: Not on file  Social Connections: Not on file  Intimate Partner Violence: Not on file   Health Maintenance  Topic Date Due   Zoster Vaccines- Shingrix (1 of 2) Never done   PAP SMEAR-Modifier  10/30/2019   COVID-19 Vaccine (4 - Booster for Pfizer series) 09/06/2020   INFLUENZA VACCINE  02/10/2021   HIV Screening  12/27/2024 (Originally 11/15/1975)   TETANUS/TDAP  11/10/2021   MAMMOGRAM  08/29/2022   COLONOSCOPY (Pts 45-32yrs Insurance coverage will need to be confirmed)  04/15/2023   Hepatitis C Screening  Completed   HPV VACCINES  Aged Out    The following portions of the patient's history were reviewed and updated as appropriate: allergies, current medications, past family history, past medical history,  past social history, past surgical history, and problem list.  Review of Systems Pertinent items noted in HPI and remainder of comprehensive ROS otherwise negative.   Objective:    BP 134/78 (BP Location: Right Arm, Patient Position: Sitting, Cuff Size: Normal)   Pulse (!) 59   Temp 98.2 F (36.8 C) (Oral)   Ht 5\' 2"  (1.575 m)   Wt 149 lb 9.6 oz (67.9 kg)   LMP 03/17/2013   SpO2 98%   BMI 27.36 kg/m  General appearance: alert, cooperative, and no distress Head: Normocephalic, without obvious abnormality, atraumatic Eyes: conjunctivae/corneas clear. PERRL, EOM's intact. Fundi benign. Ears: normal TM's and external ear canals both ears Nose: Nares normal. Septum midline. Mucosa normal. No drainage or sinus tenderness. Throat: lips, mucosa, and tongue normal; teeth and gums normal Neck: no adenopathy, no carotid bruit, no JVD, supple, symmetrical, trachea midline, and thyroid not enlarged, symmetric, no tenderness/mass/nodules Lungs: clear to auscultation bilaterally Heart: regular rate and rhythm, S1, S2 normal, no murmur, click, rub or gallop Abdomen: soft, non-tender; bowel sounds normal; no masses,  no organomegaly Extremities: extremities normal, atraumatic, no cyanosis or edema Pulses: 2+ and symmetric Skin: Skin color, texture, turgor normal. No rashes or lesions Lymph nodes: Cervical, supraclavicular, and axillary nodes normal. Neurologic: Alert and oriented X 3,  normal strength and tone. Normal symmetric reflexes. Normal coordination and gait    Assessment:    Healthy female exam.      Plan:    Anticipatory guidance given including wearing seatbelts, smoke detectors in the home, increasing physical activity, increasing p.o. intake of water and vegetables. -labs -PHQ and GAD 7 score 0 -mammogram up to date, done 08/29/20 -colonoscopy done 04/14/13 due in 2024 -reviewed immunizations.  Consider shingles vaccine. -given handout -next CPE in 1 yr -will complete and fax  form for insurance company when lab results available.   See After Visit Summary for Counseling Recommendations   Need for influenza vaccination -influenza vaccine given this visit  Other neutropenia (HCC)  -stable -thought 2/2 ethnicity as additional testing with heme/onc negative. -continue to monitor -f/u with heme/onc prn - Plan: CBC with Differential/Platelet  Prediabetes  -Hgb A1c 5.9% on 10/05/19 -continue lifestyle modifications - Plan: Hemoglobin A1c  Mixed hyperlipidemia  -Lifestyle modifications - Plan: Lipid panel  F/u prn  Abbe Amsterdam, MD

## 2021-04-17 NOTE — Addendum Note (Signed)
Addended by: Elwin Mocha on: 04/17/2021 01:55 PM   Modules accepted: Orders

## 2021-08-20 ENCOUNTER — Other Ambulatory Visit (HOSPITAL_BASED_OUTPATIENT_CLINIC_OR_DEPARTMENT_OTHER): Payer: Self-pay | Admitting: Obstetrics and Gynecology

## 2021-08-20 DIAGNOSIS — Z1231 Encounter for screening mammogram for malignant neoplasm of breast: Secondary | ICD-10-CM

## 2021-09-01 ENCOUNTER — Other Ambulatory Visit: Payer: Self-pay

## 2021-09-01 ENCOUNTER — Encounter (HOSPITAL_BASED_OUTPATIENT_CLINIC_OR_DEPARTMENT_OTHER): Payer: Self-pay

## 2021-09-01 ENCOUNTER — Ambulatory Visit (HOSPITAL_BASED_OUTPATIENT_CLINIC_OR_DEPARTMENT_OTHER)
Admission: RE | Admit: 2021-09-01 | Discharge: 2021-09-01 | Disposition: A | Discharge status: Home / Self Care | Payer: BC Managed Care – PPO | Source: Ambulatory Visit | Attending: Obstetrics and Gynecology | Admitting: Obstetrics and Gynecology

## 2021-09-01 DIAGNOSIS — Z1231 Encounter for screening mammogram for malignant neoplasm of breast: Secondary | ICD-10-CM | POA: Diagnosis not present

## 2021-09-02 ENCOUNTER — Other Ambulatory Visit: Payer: Self-pay | Admitting: Obstetrics and Gynecology

## 2021-09-02 DIAGNOSIS — R928 Other abnormal and inconclusive findings on diagnostic imaging of breast: Secondary | ICD-10-CM

## 2021-09-15 ENCOUNTER — Ambulatory Visit
Admission: RE | Admit: 2021-09-15 | Discharge: 2021-09-15 | Disposition: A | Discharge status: Home / Self Care | Payer: BC Managed Care – PPO | Source: Ambulatory Visit | Attending: Obstetrics and Gynecology | Admitting: Obstetrics and Gynecology

## 2021-09-15 DIAGNOSIS — R928 Other abnormal and inconclusive findings on diagnostic imaging of breast: Secondary | ICD-10-CM

## 2021-09-24 ENCOUNTER — Other Ambulatory Visit: Payer: BC Managed Care – PPO

## 2022-04-24 ENCOUNTER — Telehealth: Payer: Self-pay | Admitting: Family Medicine

## 2022-04-24 ENCOUNTER — Ambulatory Visit (INDEPENDENT_AMBULATORY_CARE_PROVIDER_SITE_OTHER): Payer: BC Managed Care – PPO | Admitting: Family Medicine

## 2022-04-24 ENCOUNTER — Encounter: Payer: Self-pay | Admitting: Family Medicine

## 2022-04-24 VITALS — BP 118/80 | HR 59 | Temp 98.3°F | Ht 62.0 in | Wt 138.2 lb

## 2022-04-24 DIAGNOSIS — E782 Mixed hyperlipidemia: Secondary | ICD-10-CM | POA: Diagnosis not present

## 2022-04-24 DIAGNOSIS — Z23 Encounter for immunization: Secondary | ICD-10-CM | POA: Diagnosis not present

## 2022-04-24 DIAGNOSIS — Z0000 Encounter for general adult medical examination without abnormal findings: Secondary | ICD-10-CM | POA: Diagnosis not present

## 2022-04-24 DIAGNOSIS — R7303 Prediabetes: Secondary | ICD-10-CM

## 2022-04-24 LAB — CBC WITH DIFFERENTIAL/PLATELET
Basophils Absolute: 0 10*3/uL (ref 0.0–0.1)
Basophils Relative: 0.9 % (ref 0.0–3.0)
Eosinophils Absolute: 0 10*3/uL (ref 0.0–0.7)
Eosinophils Relative: 0.9 % (ref 0.0–5.0)
HCT: 37 % (ref 36.0–46.0)
Hemoglobin: 12.2 g/dL (ref 12.0–15.0)
Lymphocytes Relative: 51.3 % — ABNORMAL HIGH (ref 12.0–46.0)
Lymphs Abs: 1.6 10*3/uL (ref 0.7–4.0)
MCHC: 33 g/dL (ref 30.0–36.0)
MCV: 87.4 fl (ref 78.0–100.0)
Monocytes Absolute: 0.2 10*3/uL (ref 0.1–1.0)
Monocytes Relative: 7.6 % (ref 3.0–12.0)
Neutro Abs: 1.2 10*3/uL — ABNORMAL LOW (ref 1.4–7.7)
Neutrophils Relative %: 39.3 % — ABNORMAL LOW (ref 43.0–77.0)
Platelets: 301 10*3/uL (ref 150.0–400.0)
RBC: 4.23 Mil/uL (ref 3.87–5.11)
RDW: 14.8 % (ref 11.5–15.5)
WBC: 3.1 10*3/uL — ABNORMAL LOW (ref 4.0–10.5)

## 2022-04-24 LAB — BASIC METABOLIC PANEL
BUN: 15 mg/dL (ref 6–23)
CO2: 29 mEq/L (ref 19–32)
Calcium: 9.8 mg/dL (ref 8.4–10.5)
Chloride: 104 mEq/L (ref 96–112)
Creatinine, Ser: 0.7 mg/dL (ref 0.40–1.20)
GFR: 94.65 mL/min (ref >60.00)
Glucose, Bld: 84 mg/dL (ref 70–99)
Potassium: 3.9 mEq/L (ref 3.5–5.1)
Sodium: 141 mEq/L (ref 135–145)

## 2022-04-24 LAB — TSH: TSH: 1.38 u[IU]/mL (ref 0.35–5.50)

## 2022-04-24 LAB — LIPID PANEL
Cholesterol: 203 mg/dL — ABNORMAL HIGH (ref 0–200)
HDL: 120 mg/dL (ref >39.00)
LDL Cholesterol: 75 mg/dL (ref 0–99)
NonHDL: 82.77
Total CHOL/HDL Ratio: 2
Triglycerides: 39 mg/dL (ref 0.0–149.0)
VLDL: 7.8 mg/dL (ref 0.0–40.0)

## 2022-04-24 LAB — HEMOGLOBIN A1C: Hgb A1c MFr Bld: 6 % (ref 4.6–6.5)

## 2022-04-24 LAB — T4, FREE: Free T4: 0.69 ng/dL (ref 0.60–1.60)

## 2022-04-24 NOTE — Telephone Encounter (Signed)
Pt dropped off form to be signed by Dr. Haynes Hoehn has been placed in red folder.   Please advise

## 2022-04-24 NOTE — Telephone Encounter (Signed)
Form rec'd. Will complete and fax for pt when results are in.

## 2022-04-24 NOTE — Progress Notes (Signed)
Subjective:     Catherine Conway is a 61 y.o. female and is here for a comprehensive physical exam. The patient reports doing well overall.  Staying busy at work.  Patient without Current issues or concerns.  Inquires about shingles vaccine, had chkn pox as child.  Interested in flu and covid vaccine.  Social History   Socioeconomic History   Marital status: Single    Spouse name: Not on file   Number of children: Not on file   Years of education: Not on file   Highest education level: Not on file  Occupational History   Not on file  Tobacco Use   Smoking status: Never   Smokeless tobacco: Never  Vaping Use   Vaping Use: Never used  Substance and Sexual Activity   Alcohol use: No   Drug use: No   Sexual activity: Not on file  Other Topics Concern   Not on file  Social History Narrative   Work or School: Architectural technologist      Home Situation: lives with her sister      Spiritual Beliefs: Christian      Lifestyle: 5 days per week of exercise - working with Clinical research associate; eats healthy      Social Determinants of Health   Financial Resource Strain: Not on file  Food Insecurity: Not on file  Transportation Needs: Not on file  Physical Activity: Not on file  Stress: Not on file  Social Connections: Not on file  Intimate Partner Violence: Not on file   Health Maintenance  Topic Date Due   Zoster Vaccines- Shingrix (1 of 2) Never done   COVID-19 Vaccine (4 - Pfizer risk series) 08/09/2020   TETANUS/TDAP  11/10/2021   PAP SMEAR-Modifier  01/18/2022   INFLUENZA VACCINE  02/10/2022   HIV Screening  12/27/2024 (Originally 11/15/1975)   COLONOSCOPY (Pts 45-103yrs Insurance coverage will need to be confirmed)  04/15/2023   MAMMOGRAM  09/02/2023   Hepatitis C Screening  Completed   HPV VACCINES  Aged Out    The following portions of the patient's history were reviewed and updated as appropriate: allergies, current medications, past family history, past medical history, past social  history, past surgical history, and problem list.  Review of Systems Pertinent items noted in HPI and remainder of comprehensive ROS otherwise negative.   Objective:    BP 118/80 (BP Location: Left Arm, Patient Position: Sitting, Cuff Size: Normal)   Pulse (!) 59   Temp 98.3 F (36.8 C) (Oral)   Ht 5\' 2"  (1.575 m)   Wt 138 lb 3.2 oz (62.7 kg)   LMP 03/17/2013   SpO2 97%   BMI 25.28 kg/m  General appearance: alert, cooperative, and no distress Head: Normocephalic, without obvious abnormality, atraumatic Eyes: conjunctivae/corneas clear. PERRL, EOM's intact. Fundi benign. Ears: normal TM's and external ear canals both ears Nose: Nares normal. Septum midline. Mucosa normal. No drainage or sinus tenderness. Throat: lips, mucosa, and tongue normal; teeth and gums normal Neck: no adenopathy, no carotid bruit, no JVD, supple, symmetrical, trachea midline, and thyroid not enlarged, symmetric, no tenderness/mass/nodules Lungs: clear to auscultation bilaterally Heart: regular rate and rhythm, S1, S2 normal, no murmur, click, rub or gallop Abdomen: soft, non-tender; bowel sounds normal; no masses,  no organomegaly Extremities: extremities normal, atraumatic, no cyanosis or edema Pulses: 2+ and symmetric Skin: Skin color, texture, turgor normal. No rashes or lesions Lymph nodes: Cervical, supraclavicular, and axillary nodes normal. Neurologic: Alert and oriented X 3, normal strength and  tone. Normal symmetric reflexes. Normal coordination and gait    Assessment:    Healthy female exam.      Plan:    Anticipatory guidance given including wearing seatbelts, smoke detectors in the home, increasing physical activity, increasing p.o. intake of water and vegetables. -labs -mammogram up to date, done 09/15/21 -colonoscopy done 04/14/13 -immunizations reviewed -pap done 01/19/2019.  Due 2025 -given handout -Next CPE in 1 year See After Visit Summary for Counseling Recommendations  Well adult  exam - Plan: CBC with Differential/Platelet, Basic metabolic panel, TSH, T4, Free  Mixed hyperlipidemia  -Total cholesterol 210 HDL 119, LDL 82, triglycerides 43 on 04/17/2021. - Plan: Lipid panel  Prediabetes  -hgb A1C 6.3% on 04/17/21 -lifestyle modifications - Plan: Hemoglobin A1c  Need for influenza vaccination  - Plan: Flu Vaccine QUAD 6+ mos PF IM (Fluarix Quad PF)  COVID-19 vaccine administered  - Plan: PFIZER Comirnaty(GRAY TOP)COVID-19 Vaccine  F/u prn  Grier Mitts, MD

## 2022-04-24 NOTE — Patient Instructions (Signed)
I have included information about shingles vaccine.

## 2022-04-27 NOTE — Telephone Encounter (Signed)
Form completed and faxed, per request of pt. Fax confirmation received.

## 2023-02-17 LAB — HM PAP SMEAR: HM Pap smear: NORMAL

## 2023-05-05 ENCOUNTER — Encounter: Payer: Self-pay | Admitting: Family Medicine

## 2023-05-05 ENCOUNTER — Ambulatory Visit (INDEPENDENT_AMBULATORY_CARE_PROVIDER_SITE_OTHER): Payer: BC Managed Care – PPO | Admitting: Family Medicine

## 2023-05-05 VITALS — BP 120/80 | HR 63 | Temp 98.2°F | Ht 62.0 in | Wt 146.4 lb

## 2023-05-05 DIAGNOSIS — E782 Mixed hyperlipidemia: Secondary | ICD-10-CM | POA: Diagnosis not present

## 2023-05-05 DIAGNOSIS — Z1211 Encounter for screening for malignant neoplasm of colon: Secondary | ICD-10-CM

## 2023-05-05 DIAGNOSIS — R7303 Prediabetes: Secondary | ICD-10-CM

## 2023-05-05 DIAGNOSIS — Z0000 Encounter for general adult medical examination without abnormal findings: Secondary | ICD-10-CM | POA: Diagnosis not present

## 2023-05-05 DIAGNOSIS — Z23 Encounter for immunization: Secondary | ICD-10-CM | POA: Diagnosis not present

## 2023-05-05 LAB — COMPREHENSIVE METABOLIC PANEL
ALT: 12 U/L (ref 0–35)
AST: 18 U/L (ref 0–37)
Albumin: 4.3 g/dL (ref 3.5–5.2)
Alkaline Phosphatase: 63 U/L (ref 39–117)
BUN: 16 mg/dL (ref 6–23)
CO2: 27 meq/L (ref 19–32)
Calcium: 9.5 mg/dL (ref 8.4–10.5)
Chloride: 104 meq/L (ref 96–112)
Creatinine, Ser: 0.73 mg/dL (ref 0.40–1.20)
GFR: 89.36 mL/min (ref >60.00)
Glucose, Bld: 91 mg/dL (ref 70–99)
Potassium: 4 meq/L (ref 3.5–5.1)
Sodium: 140 meq/L (ref 135–145)
Total Bilirubin: 0.3 mg/dL (ref 0.2–1.2)
Total Protein: 7.6 g/dL (ref 6.0–8.3)

## 2023-05-05 LAB — T4, FREE: Free T4: 0.71 ng/dL (ref 0.60–1.60)

## 2023-05-05 LAB — LIPID PANEL
Cholesterol: 198 mg/dL (ref 0–200)
HDL: 102.3 mg/dL (ref >39.00)
LDL Cholesterol: 84 mg/dL (ref 0–99)
NonHDL: 95.21
Total CHOL/HDL Ratio: 2
Triglycerides: 58 mg/dL (ref 0.0–149.0)
VLDL: 11.6 mg/dL (ref 0.0–40.0)

## 2023-05-05 LAB — CBC WITH DIFFERENTIAL/PLATELET
Basophils Absolute: 0 10*3/uL (ref 0.0–0.1)
Basophils Relative: 0.8 % (ref 0.0–3.0)
Eosinophils Absolute: 0 10*3/uL (ref 0.0–0.7)
Eosinophils Relative: 0.9 % (ref 0.0–5.0)
HCT: 39.4 % (ref 36.0–46.0)
Hemoglobin: 12.4 g/dL (ref 12.0–15.0)
Lymphocytes Relative: 55.4 % — ABNORMAL HIGH (ref 12.0–46.0)
Lymphs Abs: 1.8 10*3/uL (ref 0.7–4.0)
MCHC: 31.5 g/dL (ref 30.0–36.0)
MCV: 88 fL (ref 78.0–100.0)
Monocytes Absolute: 0.3 10*3/uL (ref 0.1–1.0)
Monocytes Relative: 8.9 % (ref 3.0–12.0)
Neutro Abs: 1.1 10*3/uL — ABNORMAL LOW (ref 1.4–7.7)
Neutrophils Relative %: 34 % — ABNORMAL LOW (ref 43.0–77.0)
Platelets: 307 10*3/uL (ref 150.0–400.0)
RBC: 4.48 Mil/uL (ref 3.87–5.11)
RDW: 14.3 % (ref 11.5–15.5)
WBC: 3.3 10*3/uL — ABNORMAL LOW (ref 4.0–10.5)

## 2023-05-05 LAB — TSH: TSH: 1.07 u[IU]/mL (ref 0.35–5.50)

## 2023-05-05 LAB — HEMOGLOBIN A1C: Hgb A1c MFr Bld: 6 % (ref 4.6–6.5)

## 2023-05-05 NOTE — Progress Notes (Signed)
Established Patient Office Visit   Subjective  Patient ID: Catherine Conway, female    DOB: August 24, 1960  Age: 61 y.o. MRN: [id number]  Chief Complaint  Patient presents with   Annual Exam    Patient is a 62 year old female seen for CPE.  Patient states she has been doing well overall.  Had COVID-19 vaccine on 04/10/2023.  Patient seen by OB/GYN on 02/17/2023 for Pap.  Colonoscopy due this year.  Interested in flu and shingles vaccine as her brother-in-law just had shingles.    Patient Active Problem List   Diagnosis Date Noted   Leukopenia 01/12/2017   History reviewed. No pertinent past medical history. History reviewed. No pertinent surgical history. Social History   Tobacco Use   Smoking status: Never   Smokeless tobacco: Never  Vaping Use   Vaping status: Never Used  Substance Use Topics   Alcohol use: No   Drug use: No   Family History  Problem Relation Age of Onset   Colon cancer Mother 57   Esophageal cancer Neg Hx    Stomach cancer Neg Hx    Rectal cancer Neg Hx    Allergies  Allergen Reactions   No Known Allergies       ROS Negative unless stated above    Objective:     BP 120/80 (BP Location: Left Arm, Patient Position: Sitting, Cuff Size: Normal)   Pulse 63   Temp 98.2 F (36.8 C) (Oral)   Ht 5\' 2"  (1.575 m)   Wt 146 lb 6.4 oz (66.4 kg)   LMP 03/17/2013   SpO2 98%   BMI 26.78 kg/m  BP Readings from Last 3 Encounters:  05/05/23 120/80  04/24/22 118/80  04/17/21 134/78   Wt Readings from Last 3 Encounters:  05/05/23 146 lb 6.4 oz (66.4 kg)  04/24/22 138 lb 3.2 oz (62.7 kg)  04/17/21 149 lb 9.6 oz (67.9 kg)      Physical Exam Constitutional:      Appearance: Normal appearance.  HENT:     Head: Normocephalic and atraumatic.     Right Ear: Tympanic membrane, ear canal and external ear normal.     Left Ear: Tympanic membrane, ear canal and external ear normal.     Nose: Nose normal.     Mouth/Throat:     Mouth: Mucous membranes are  moist.     Pharynx: No oropharyngeal exudate or posterior oropharyngeal erythema.  Eyes:     General: No scleral icterus.    Extraocular Movements: Extraocular movements intact.     Conjunctiva/sclera: Conjunctivae normal.     Pupils: Pupils are equal, round, and reactive to light.  Neck:     Thyroid: No thyromegaly.  Cardiovascular:     Rate and Rhythm: Normal rate and regular rhythm.     Pulses: Normal pulses.     Heart sounds: Normal heart sounds. No murmur heard.    No friction rub.  Pulmonary:     Effort: Pulmonary effort is normal.     Breath sounds: Normal breath sounds. No wheezing, rhonchi or rales.  Abdominal:     General: Bowel sounds are normal.     Palpations: Abdomen is soft.     Tenderness: There is no abdominal tenderness.  Musculoskeletal:        General: No deformity. Normal range of motion.  Lymphadenopathy:     Cervical: No cervical adenopathy.  Skin:    General: Skin is warm and dry.     Findings: No lesion.  Neurological:     General: No focal deficit present.     Mental Status: She is alert and oriented to person, place, and time.  Psychiatric:        Mood and Affect: Mood normal.        Thought Content: Thought content normal.      Results for orders placed or performed in visit on 05/05/23  HM PAP SMEAR  Result Value Ref Range   HM Pap smear normal       Assessment & Plan:  Encounter for preventive health examination -     CBC with Differential/Platelet -     TSH -     T4, free  Need for influenza vaccination -     Flu vaccine trivalent PF, 6mos and older(Flulaval,Afluria,Fluarix,Fluzone)  Need for shingles vaccine -     Varicella-zoster vaccine IM  Colon cancer screening -     Ambulatory referral to Gastroenterology  Mixed hyperlipidemia -Minimal elevation in total cholesterol at 203, HDL 120, LDL 75, triglycerides 39 on 04/24/2022. -Continue lifestyle modifications -     Comprehensive metabolic panel -     Lipid  panel  Prediabetes -Hemoglobin A1c 6.0% on 04/24/2022 -Lifestyle modification strongly encouraged -     Hemoglobin A1c  Age-appropriate health screenings discussed.  Will obtain labs.  Colonoscopy due this year. Last done 04/14/2013.  Referral placed.  Mammogram done 09/15/2021.  Patient to schedule.  Pap done 02/17/2023 with OB/GYN.  Immunizations reviewed.  Influenza and shingles vaccine dose 1 given this visit.  Patient had Tdap done later on.  No follow-ups on file.   Deeann Saint, MD

## 2023-05-05 NOTE — Patient Instructions (Signed)
A referral to gastroenterologist was placed for colonoscopy.  They will contact you in regards to setting up the appointment.

## 2023-06-14 ENCOUNTER — Ambulatory Visit (AMBULATORY_SURGERY_CENTER): Payer: BC Managed Care – PPO

## 2023-06-14 VITALS — Ht 62.0 in | Wt 145.0 lb

## 2023-06-14 DIAGNOSIS — Z1211 Encounter for screening for malignant neoplasm of colon: Secondary | ICD-10-CM

## 2023-06-14 MED ORDER — NA SULFATE-K SULFATE-MG SULF 17.5-3.13-1.6 GM/177ML PO SOLN: 1.0000 | Freq: Once | ORAL | 0 refills | Status: AC

## 2023-06-14 NOTE — Progress Notes (Signed)
No egg or soy allergy known to patient  No issues known to pt with past sedation with any surgeries or procedures Patient denies ever being told they had issues or difficulty with intubation  No FH of Malignant Hyperthermia Pt is not on diet pills Pt is not on  home 02  Pt is not on blood thinners  Pt denies issues with constipation  No A fib or A flutter Have any cardiac testing pending--no  LOA: independent  Prep: suprep   Patient's chart reviewed by Cathlyn Parsons CNRA prior to previsit and patient appropriate for the [facility].  Previsit completed and red dot placed by patient's name on their procedure day (on provider's schedule).     PV competed with patient. Prep instructions sent via mychart and home address. Goodrx coupon for provided to use for price reduction if needed.

## 2023-06-25 ENCOUNTER — Encounter: Payer: Self-pay | Admitting: Internal Medicine

## 2023-06-28 ENCOUNTER — Telehealth: Payer: Self-pay | Admitting: Internal Medicine

## 2023-06-28 NOTE — Telephone Encounter (Signed)
Inbound call from patient requesting for updated prep instructions for 08/06/2023 colonoscopy. Please advise, thank you.

## 2023-06-29 NOTE — Telephone Encounter (Signed)
New prep instructions sent via mychart. Hard copy will be mailed to address on file.

## 2023-07-09 ENCOUNTER — Encounter: Payer: BC Managed Care – PPO | Admitting: Internal Medicine

## 2023-08-05 ENCOUNTER — Ambulatory Visit: Payer: BC Managed Care – PPO

## 2023-08-06 ENCOUNTER — Encounter: Payer: BC Managed Care – PPO | Admitting: Internal Medicine

## 2023-08-13 ENCOUNTER — Ambulatory Visit (INDEPENDENT_AMBULATORY_CARE_PROVIDER_SITE_OTHER): Payer: BC Managed Care – PPO

## 2023-08-13 DIAGNOSIS — Z23 Encounter for immunization: Secondary | ICD-10-CM

## 2023-09-16 NOTE — Progress Notes (Signed)
 [facility] History and Physical   Primary Care Physician:  Deeann Saint, MD   Reason for Procedure:   CRCA screen  Plan:    colonoscopy     HPI: Catherine Conway is a 62 y.o. female for repeat screening colonoscopy - 2014 was negative. Elderly mother had CRCA   History reviewed. No pertinent past medical history.  Past Surgical History:  Procedure Laterality Date   COLONOSCOPY  2014    Prior to Admission medications   Not on File    No current outpatient medications on file.   Current Facility-Administered Medications  Medication Dose Route Frequency Provider Last Rate Last Admin   0.9 %  sodium chloride infusion  500 mL Intravenous Once Iva Boop, MD        Allergies as of 09/17/2023   (No Active Allergies)    Family History  Problem Relation Age of Onset   Colon cancer Mother 46   Esophageal cancer Neg Hx    Stomach cancer Neg Hx    Rectal cancer Neg Hx    Colon polyps Neg Hx     Social History   Socioeconomic History   Marital status: Single    Spouse name: Not on file   Number of children: Not on file   Years of education: Not on file   Highest education level: Bachelor's degree (e.g., BA, AB, BS)  Occupational History   Not on file  Tobacco Use   Smoking status: Never   Smokeless tobacco: Never  Vaping Use   Vaping status: Never Used  Substance and Sexual Activity   Alcohol use: No   Drug use: No   Sexual activity: Not on file  Other Topics Concern   Not on file  Social History Narrative   Work or School: Futures trader Situation: lives with her sister      Spiritual Beliefs: Christian      Lifestyle: 5 days per week of exercise - working with Psychologist, educational; eats healthy      Social Drivers of Health   Financial Resource Strain: Low Risk  (05/04/2023)   Overall Financial Resource Strain (CARDIA)    Difficulty of Paying Living Expenses: Not hard at all  Food Insecurity: No Food Insecurity (05/04/2023)    Hunger Vital Sign    Worried About Running Out of Food in the Last Year: Never true    Ran Out of Food in the Last Year: Never true  Transportation Needs: No Transportation Needs (05/04/2023)   PRAPARE - Administrator, Civil Service (Medical): No    Lack of Transportation (Non-Medical): No  Physical Activity: Sufficiently Active (05/04/2023)   Exercise Vital Sign    Days of Exercise per Week: 4 days    Minutes of Exercise per Session: 50 min  Stress: No Stress Concern Present (05/04/2023)   [employer] of Occupational Health - Occupational Stress Questionnaire    Feeling of Stress : Not at all  Social Connections: Moderately Integrated (05/04/2023)   Social Connection and Isolation Panel [NHANES]    Frequency of Communication with Friends and Family: More than three times a week    Frequency of Social Gatherings with Friends and Family: Once a week    Attends Religious Services: More than 4 times per year    Active Member of [employer] or Organizations: Yes    Attends Banker Meetings: More than 4 times per year    Marital Status: Never  married  Intimate Partner Violence: Not on file    Review of Systems:  All other review of systems negative except as mentioned in the HPI.  Physical Exam: Vital signs BP (!) 150/95   Pulse 63   Temp 97.7 F (36.5 C) (Skin)   Resp 14   Ht 5\' 2"  (1.575 m)   Wt 145 lb (65.8 kg)   LMP 03/17/2013   SpO2 100%   BMI 26.52 kg/m   General:   Alert,  Well-developed, well-nourished, pleasant and cooperative in NAD Lungs:  Clear throughout to auscultation.   Heart:  Regular rate and rhythm; no murmurs, clicks, rubs,  or gallops. Abdomen:  Soft, nontender and nondistended. Normal bowel sounds.   Neuro/Psych:  Alert and cooperative. Normal mood and affect. A and O x 3   @Stina Gane  Sena Slate, MD, [facility] [phone] (pager) 09/17/2023 11:25 AM@

## 2023-09-17 ENCOUNTER — Ambulatory Visit: Payer: BC Managed Care – PPO | Admitting: Internal Medicine

## 2023-09-17 ENCOUNTER — Encounter: Payer: Self-pay | Admitting: Internal Medicine

## 2023-09-17 VITALS — BP 127/75 | HR 58 | Temp 97.7°F | Resp 12 | Ht 62.0 in | Wt 145.0 lb

## 2023-09-17 DIAGNOSIS — Z1211 Encounter for screening for malignant neoplasm of colon: Secondary | ICD-10-CM | POA: Diagnosis present

## 2023-09-17 DIAGNOSIS — K635 Polyp of colon: Secondary | ICD-10-CM

## 2023-09-17 DIAGNOSIS — D123 Benign neoplasm of transverse colon: Secondary | ICD-10-CM

## 2023-09-17 MED ORDER — SODIUM CHLORIDE 0.9 % IV SOLN: 500.0000 mL | Freq: Once | INTRAVENOUS | Status: DC

## 2023-09-17 NOTE — Patient Instructions (Addendum)
 I found and removed one tiny polyp that looks benign.  All else normal.  I will let you know pathology results and when to have another routine colonoscopy by mail and/or My Chart.  I appreciate the opportunity to care for you. Iva Boop, MD, [facility]  Handout provided on polyps.  Resume previous diet.  Continue present medications.   YOU HAD AN ENDOSCOPIC PROCEDURE TODAY AT THE [facility]:   Refer to the procedure report that was given to you for any specific questions about what was found during the examination.  If the procedure report does not answer your questions, please call your gastroenterologist to clarify.  If you requested that your care partner not be given the details of your procedure findings, then the procedure report has been included in a sealed envelope for you to review at your convenience later.  YOU SHOULD EXPECT: Some feelings of bloating in the abdomen. Passage of more gas than usual.  Walking can help get rid of the air that was put into your GI tract during the procedure and reduce the bloating. If you had a lower endoscopy (such as a colonoscopy or flexible sigmoidoscopy) you may notice spotting of blood in your stool or on the toilet paper. If you underwent a bowel prep for your procedure, you may not have a normal bowel movement for a few days.  Please Note:  You might notice some irritation and congestion in your nose or some drainage.  This is from the oxygen used during your procedure.  There is no need for concern and it should clear up in a day or so.  SYMPTOMS TO REPORT IMMEDIATELY:  Following lower endoscopy (colonoscopy or flexible sigmoidoscopy):  Excessive amounts of blood in the stool  Significant tenderness or worsening of abdominal pains  Swelling of the abdomen that is new, acute  Fever of 100F or higher  For urgent or emergent issues, a gastroenterologist can be reached at any hour by calling [phone]. Do not use MyChart  messaging for urgent concerns.    DIET:  We do recommend a small meal at first, but then you may proceed to your regular diet.  Drink plenty of fluids but you should avoid alcoholic beverages for 24 hours.  ACTIVITY:  You should plan to take it easy for the rest of today and you should NOT DRIVE or use heavy machinery until tomorrow (because of the sedation medicines used during the test).    FOLLOW UP: Our staff will call the number listed on your records the next business day following your procedure.  We will call around 7:15- 8:00 am to check on you and address any questions or concerns that you may have regarding the information given to you following your procedure. If we do not reach you, we will leave a message.     If any biopsies were taken you will be contacted by phone or by letter within the next 1-3 weeks.  Please call us at [phone] if you have not heard about the biopsies in 3 weeks.    SIGNATURES/CONFIDENTIALITY: You and/or your care partner have signed paperwork which will be entered into your electronic medical record.  These signatures attest to the fact that that the information above on your After Visit Summary has been reviewed and is understood.  Full responsibility of the confidentiality of this discharge information lies with you and/or your care-partner.

## 2023-09-17 NOTE — Op Note (Signed)
 [facility] Patient Name: Catherine Conway Procedure Date: 09/17/2023 11:22 AM MRN: [id number] Endoscopist: Iva Boop , MD, [id number] Age: 63 Referring MD:  Date of Birth: 28-Jul-1960 Gender: Female Account #: [id number] Procedure:                Colonoscopy Indications:              Screening for colorectal malignant neoplasm, Last                            colonoscopy: 2014 Medicines:                Monitored Anesthesia Care Procedure:                Pre-Anesthesia Assessment:                           - Prior to the procedure, a History and Physical                            was performed, and patient medications and                            allergies were reviewed. The patient's tolerance of                            previous anesthesia was also reviewed. The risks                            and benefits of the procedure and the sedation                            options and risks were discussed with the patient.                            All questions were answered, and informed consent                            was obtained. Prior Anticoagulants: The patient has                            taken no anticoagulant or antiplatelet agents. ASA                            Grade Assessment: I - A normal, healthy patient.                            After reviewing the risks and benefits, the patient                            was deemed in satisfactory condition to undergo the                            procedure.  After obtaining informed consent, the colonoscope                            was passed under direct vision. Throughout the                            procedure, the patient's blood pressure, pulse, and                            oxygen saturations were monitored continuously. The                            [id number]-HQ190L Colonoscope [id number] was introduced                            through the anus and advanced to the the cecum,                             identified by appendiceal orifice and ileocecal                            valve. The colonoscopy was performed without                            difficulty. The patient tolerated the procedure                            well. The quality of the bowel preparation was                            excellent. The bowel preparation used was SUPREP                            via split dose instruction. The ileocecal valve,                            appendiceal orifice, and rectum were photographed. Scope In: 11:34:53 AM Scope Out: 11:49:59 AM Scope Withdrawal Time: 0 hours 10 minutes 59 seconds  Total Procedure Duration: 0 hours 15 minutes 6 seconds  Findings:                 The perianal and digital rectal examinations were                            normal.                           A 5 mm polyp was found in the splenic flexure. The                            polyp was sessile. The polyp was removed with a                            cold snare. Resection and retrieval were complete.  Verification of patient identification for the                            specimen was done. Estimated blood loss was minimal.                           The exam was otherwise without abnormality on                            direct and retroflexion views. Complications:            No immediate complications. Estimated Blood Loss:     Estimated blood loss was minimal. Impression:               - One 5 mm polyp at the splenic flexure, removed                            with a cold snare. Resected and retrieved.                           - The examination was otherwise normal on direct                            and retroflexion views.                           - Family history of colon cancer - elderly mother                            so not higher risk Recommendation:           - Patient has a contact number available for                            emergencies. The  signs and symptoms of potential                            delayed complications were discussed with the                            patient. Return to normal activities tomorrow.                            Written discharge instructions were provided to the                            patient.                           - Resume previous diet.                           - Continue present medications.                           - Repeat colonoscopy is recommended. The  colonoscopy date will be determined after pathology                            results from today's exam become available for                            review. Iva Boop, MD 09/17/2023 11:56:06 AM This report has been signed electronically.

## 2023-09-17 NOTE — Progress Notes (Signed)
 Called to room to assist during endoscopic procedure.  Patient ID and intended procedure confirmed with present staff. Received instructions for my participation in the procedure from the performing physician.

## 2023-09-17 NOTE — Progress Notes (Signed)
 Pt's states no medical or surgical changes since previsit or office visit.

## 2023-09-17 NOTE — Progress Notes (Signed)
 Vss nad trans to pacu

## 2023-09-20 ENCOUNTER — Telehealth: Payer: Self-pay

## 2023-09-20 NOTE — Telephone Encounter (Signed)
 Left message on answering machine.

## 2023-09-22 LAB — SURGICAL PATHOLOGY

## 2023-09-24 ENCOUNTER — Encounter: Payer: Self-pay | Admitting: Internal Medicine

## 2024-01-06 ENCOUNTER — Other Ambulatory Visit: Payer: Self-pay | Admitting: Obstetrics and Gynecology

## 2024-01-06 DIAGNOSIS — Z1231 Encounter for screening mammogram for malignant neoplasm of breast: Secondary | ICD-10-CM

## 2024-02-22 LAB — HM PAP SMEAR

## 2024-03-10 ENCOUNTER — Other Ambulatory Visit: Payer: Self-pay

## 2024-03-10 ENCOUNTER — Ambulatory Visit
Admission: RE | Admit: 2024-03-10 | Discharge: 2024-03-10 | Disposition: A | Discharge status: Home / Self Care | Source: Ambulatory Visit

## 2024-03-10 DIAGNOSIS — Z1231 Encounter for screening mammogram for malignant neoplasm of breast: Secondary | ICD-10-CM

## 2024-05-11 ENCOUNTER — Encounter: Admitting: Family Medicine

## 2024-05-17 ENCOUNTER — Encounter: Payer: Self-pay | Admitting: Family Medicine

## 2024-05-18 ENCOUNTER — Ambulatory Visit (INDEPENDENT_AMBULATORY_CARE_PROVIDER_SITE_OTHER): Admitting: Family Medicine

## 2024-05-18 ENCOUNTER — Encounter: Payer: Self-pay | Admitting: Family Medicine

## 2024-05-18 VITALS — BP 138/67 | HR 67 | Temp 97.7°F | Ht 62.0 in | Wt 146.4 lb

## 2024-05-18 DIAGNOSIS — Z0000 Encounter for general adult medical examination without abnormal findings: Secondary | ICD-10-CM | POA: Diagnosis not present

## 2024-05-18 DIAGNOSIS — Z23 Encounter for immunization: Secondary | ICD-10-CM

## 2024-05-18 DIAGNOSIS — R7303 Prediabetes: Secondary | ICD-10-CM

## 2024-05-18 LAB — CBC WITH DIFFERENTIAL/PLATELET
Basophils Absolute: 0 K/uL (ref 0.0–0.1)
Basophils Relative: 0.5 % (ref 0.0–3.0)
Eosinophils Absolute: 0 K/uL (ref 0.0–0.7)
Eosinophils Relative: 0.3 % (ref 0.0–5.0)
HCT: 40.5 % (ref 36.0–46.0)
Hemoglobin: 13.4 g/dL (ref 12.0–15.0)
Lymphocytes Relative: 38.7 % (ref 12.0–46.0)
Lymphs Abs: 1.6 K/uL (ref 0.7–4.0)
MCHC: 33.1 g/dL (ref 30.0–36.0)
MCV: 87 fl (ref 78.0–100.0)
Monocytes Absolute: 0.2 K/uL (ref 0.1–1.0)
Monocytes Relative: 5 % (ref 3.0–12.0)
Neutro Abs: 2.3 K/uL (ref 1.4–7.7)
Neutrophils Relative %: 55.5 % (ref 43.0–77.0)
Platelets: 287 K/uL (ref 150.0–400.0)
RBC: 4.66 Mil/uL (ref 3.87–5.11)
RDW: 15 % (ref 11.5–15.5)
WBC: 4.2 K/uL (ref 4.0–10.5)

## 2024-05-18 LAB — COMPREHENSIVE METABOLIC PANEL WITH GFR
ALT: 15 U/L (ref 0–35)
AST: 24 U/L (ref 0–37)
Albumin: 4.5 g/dL (ref 3.5–5.2)
Alkaline Phosphatase: 58 U/L (ref 39–117)
BUN: 10 mg/dL (ref 6–23)
CO2: 27 meq/L (ref 19–32)
Calcium: 10 mg/dL (ref 8.4–10.5)
Chloride: 99 meq/L (ref 96–112)
Creatinine, Ser: 0.79 mg/dL (ref 0.40–1.20)
GFR: 80.69 mL/min (ref >60.00)
Glucose, Bld: 53 mg/dL — ABNORMAL LOW (ref 70–99)
Potassium: 3.9 meq/L (ref 3.5–5.1)
Sodium: 139 meq/L (ref 135–145)
Total Bilirubin: 0.6 mg/dL (ref 0.2–1.2)
Total Protein: 7.9 g/dL (ref 6.0–8.3)

## 2024-05-18 LAB — LIPID PANEL
Cholesterol: 182 mg/dL (ref 0–200)
HDL: 107.1 mg/dL (ref >39.00)
LDL Cholesterol: 60 mg/dL (ref 0–99)
NonHDL: 75.24
Total CHOL/HDL Ratio: 2
Triglycerides: 77 mg/dL (ref 0.0–149.0)
VLDL: 15.4 mg/dL (ref 0.0–40.0)

## 2024-05-18 LAB — HEMOGLOBIN A1C: Hgb A1c MFr Bld: 5.9 % (ref 4.6–6.5)

## 2024-05-18 LAB — T4, FREE: Free T4: 0.69 ng/dL (ref 0.60–1.60)

## 2024-05-18 LAB — TSH: TSH: 0.7 u[IU]/mL (ref 0.35–5.50)

## 2024-05-18 NOTE — Progress Notes (Signed)
 Established Patient Office Visit   Subjective  Patient ID: Catherine Conway, female    DOB: [birth year]  Age: 63 y.o. MRN: [id number]  Chief Complaint  Patient presents with   Annual Exam    Physical;     Pt is a 63 yo female seen for CPE.  Pt doing well.  Taking part in a 40 day Turn up program.  States it incorporates natural eating and increasing intake of carbs as vegetables and fruit to 80% with 20% of plate being protein.  Patient also has to exercise and burn at least 300 cal.  Eating 400 cal per meal.  Program requires patient to post her weight and blood pressure weekly as well as pictures of the meals she makes.  States has 1 more week of the program.  As part of the program pt does a water fast 1 day a week which was yesterday.  Patient has not had anything to eat today.  Inquires about certain imaging and test such as carotid artery ultrasound, lipoprotein a, calcium channel scoring.  Patient denies family history of CVD or HLD.      Patient Active Problem List   Diagnosis Date Noted   Leukopenia 01/12/2017   History reviewed. No pertinent past medical history. Past Surgical History:  Procedure Laterality Date   COLONOSCOPY  2014   Social History   Tobacco Use   Smoking status: Never   Smokeless tobacco: Never   Tobacco comments:    Do not use tobacco  Vaping Use   Vaping status: Never Used  Substance Use Topics   Alcohol use: No   Drug use: No   Family History  Problem Relation Age of Onset   Colon cancer Mother 15   Cancer Mother    Hypertension Mother    Cancer Brother    Esophageal cancer Neg Hx    Stomach cancer Neg Hx    Rectal cancer Neg Hx    Colon polyps Neg Hx    No Active Allergies  ROS Negative unless stated above    Objective:     BP 138/67 (BP Location: Right Arm, Patient Position: Sitting, Cuff Size: Normal)   Pulse 67   Temp 97.7 F (36.5 C)   Ht 5' 2 (1.575 m)   Wt 146 lb 6.4 oz (66.4 kg)   LMP 03/17/2013   SpO2 97%   BMI  26.78 kg/m  BP Readings from Last 3 Encounters:  05/18/24 138/67  09/17/23 127/75  05/05/23 120/80   Wt Readings from Last 3 Encounters:  05/18/24 146 lb 6.4 oz (66.4 kg)  09/17/23 145 lb (65.8 kg)  06/14/23 145 lb (65.8 kg)      Physical Exam Constitutional:      Appearance: Normal appearance.  HENT:     Head: Normocephalic and atraumatic.     Right Ear: Tympanic membrane, ear canal and external ear normal.     Left Ear: Tympanic membrane, ear canal and external ear normal.     Nose: Nose normal.     Mouth/Throat:     Mouth: Mucous membranes are moist.     Pharynx: No oropharyngeal exudate or posterior oropharyngeal erythema.  Eyes:     General: No scleral icterus.    Extraocular Movements: Extraocular movements intact.     Conjunctiva/sclera: Conjunctivae normal.     Pupils: Pupils are equal, round, and reactive to light.  Neck:     Thyroid : No thyromegaly.     Vascular: No carotid bruit.  Cardiovascular:     Rate and Rhythm: Normal rate and regular rhythm.     Pulses: Normal pulses.     Heart sounds: Normal heart sounds. No murmur heard.    No friction rub.  Pulmonary:     Effort: Pulmonary effort is normal.     Breath sounds: Normal breath sounds. No wheezing, rhonchi or rales.  Abdominal:     General: Bowel sounds are normal.     Palpations: Abdomen is soft.     Tenderness: There is no abdominal tenderness.  Musculoskeletal:        General: No deformity. Normal range of motion.  Lymphadenopathy:     Cervical: No cervical adenopathy.  Skin:    General: Skin is warm and dry.     Findings: No lesion.  Neurological:     General: No focal deficit present.     Mental Status: She is alert and oriented to person, place, and time.  Psychiatric:        Mood and Affect: Mood normal.        Thought Content: Thought content normal.        05/05/2023    9:41 AM 04/24/2022   10:21 AM 04/17/2021    4:13 PM  Depression screen PHQ 2/9  Decreased Interest 0 0 0   Down, Depressed, Hopeless 0 0 0  PHQ - 2 Score 0 0 0  Altered sleeping 0  0  Tired, decreased energy 0  0  Change in appetite 0  0  Feeling bad or failure about yourself  0  0  Trouble concentrating 0  0  Moving slowly or fidgety/restless 0  0  Suicidal thoughts 0  0  PHQ-9 Score 0   0   Difficult doing work/chores Not difficult at all       Data saved with a previous flowsheet row definition      05/18/2024   12:02 PM 05/05/2023    9:41 AM 04/17/2021    4:13 PM  GAD 7 : Generalized Anxiety Score  Nervous, Anxious, on Edge 0 0 0  Control/stop worrying 0 0 0  Worry too much - different things 0 0 0  Trouble relaxing 0 0 0  Restless 0 0 0  Easily annoyed or irritable 0 0 0  Afraid - awful might happen 0 0 0  Total GAD 7 Score 0 0 0  Anxiety Difficulty Not difficult at all Not difficult at all      No results found for any visits on 05/18/24.    Assessment & Plan:   Well adult exam -     Comprehensive metabolic panel with GFR; Future -     Lipid panel; Future -     CBC with Differential/Platelet; Future -     TSH; Future -     T4, free; Future -     Hemoglobin A1c; Future  Need for Tdap vaccination -     Tdap vaccine greater than or equal to 7yo IM  Prediabetes -     Hemoglobin A1c; Future    Age-appropriate health screenings discussed.  Obtain labs. Immunizations reviewed.  Influenza vaccine done last month at local pharmacy.  Tdap given this visit.  Patient to consider pneumonia vaccine.  Eye exam done January 2025.  Pap done 02/22/2024.  Mammogram done 03/10/2024.  Colonoscopy done 09/17/2023.  Patient advised to be mindful of carb and sweet intake given history of prediabetes.  A1c 6.0% on 05/05/2023.  BP elevation noted this  visit possibly due to fasting.  At home readings in the 1 teens 120s.  Continue to monitor.   Return in about 1 year (around 05/18/2025) for physical.   [name], MD

## 2024-05-23 ENCOUNTER — Encounter: Payer: Self-pay | Admitting: Family Medicine

## 2024-05-26 ENCOUNTER — Encounter: Admitting: Family Medicine

## 2024-05-29 ENCOUNTER — Ambulatory Visit: Payer: Self-pay | Admitting: Family Medicine
